# Patient Record
Sex: Male | Born: 1950
Health system: Southern US, Community
[De-identification: ages and names within clinical notes are randomized; demographics above are authoritative.]

## PROBLEM LIST (undated history)

## (undated) DIAGNOSIS — I10 Essential (primary) hypertension: Secondary | ICD-10-CM

## (undated) DIAGNOSIS — E7800 Pure hypercholesterolemia, unspecified: Secondary | ICD-10-CM

## (undated) DIAGNOSIS — G4730 Sleep apnea, unspecified: Secondary | ICD-10-CM

## (undated) HISTORY — PX: COLONOSCOPY: SHX5424

---

## 2002-02-02 ENCOUNTER — Encounter: Payer: Self-pay | Admitting: Family Medicine

## 2002-02-02 ENCOUNTER — Ambulatory Visit (HOSPITAL_COMMUNITY): Admission: RE | Admit: 2002-02-02 | Discharge: 2002-02-02 | Payer: Self-pay | Admitting: Family Medicine

## 2002-02-17 ENCOUNTER — Ambulatory Visit (HOSPITAL_COMMUNITY): Admission: RE | Admit: 2002-02-17 | Discharge: 2002-02-17 | Payer: Self-pay | Admitting: General Surgery

## 2007-03-31 ENCOUNTER — Ambulatory Visit (HOSPITAL_COMMUNITY): Admission: RE | Admit: 2007-03-31 | Discharge: 2007-03-31 | Payer: Self-pay | Admitting: General Surgery

## 2007-05-16 ENCOUNTER — Observation Stay (HOSPITAL_COMMUNITY): Admission: EM | Admit: 2007-05-16 | Discharge: 2007-05-17 | Payer: Self-pay | Admitting: Emergency Medicine

## 2007-06-12 ENCOUNTER — Encounter: Admission: RE | Admit: 2007-06-12 | Discharge: 2007-06-12 | Payer: Self-pay | Admitting: Internal Medicine

## 2008-07-20 IMAGING — CR DG CHEST 2V
2 series · 2 of 2 positions shown · non-contrast
Comparison: None.

CLINICAL DATA: Left-sided chest pain.  
 CHEST - 2 VIEW:

[w chest pa]
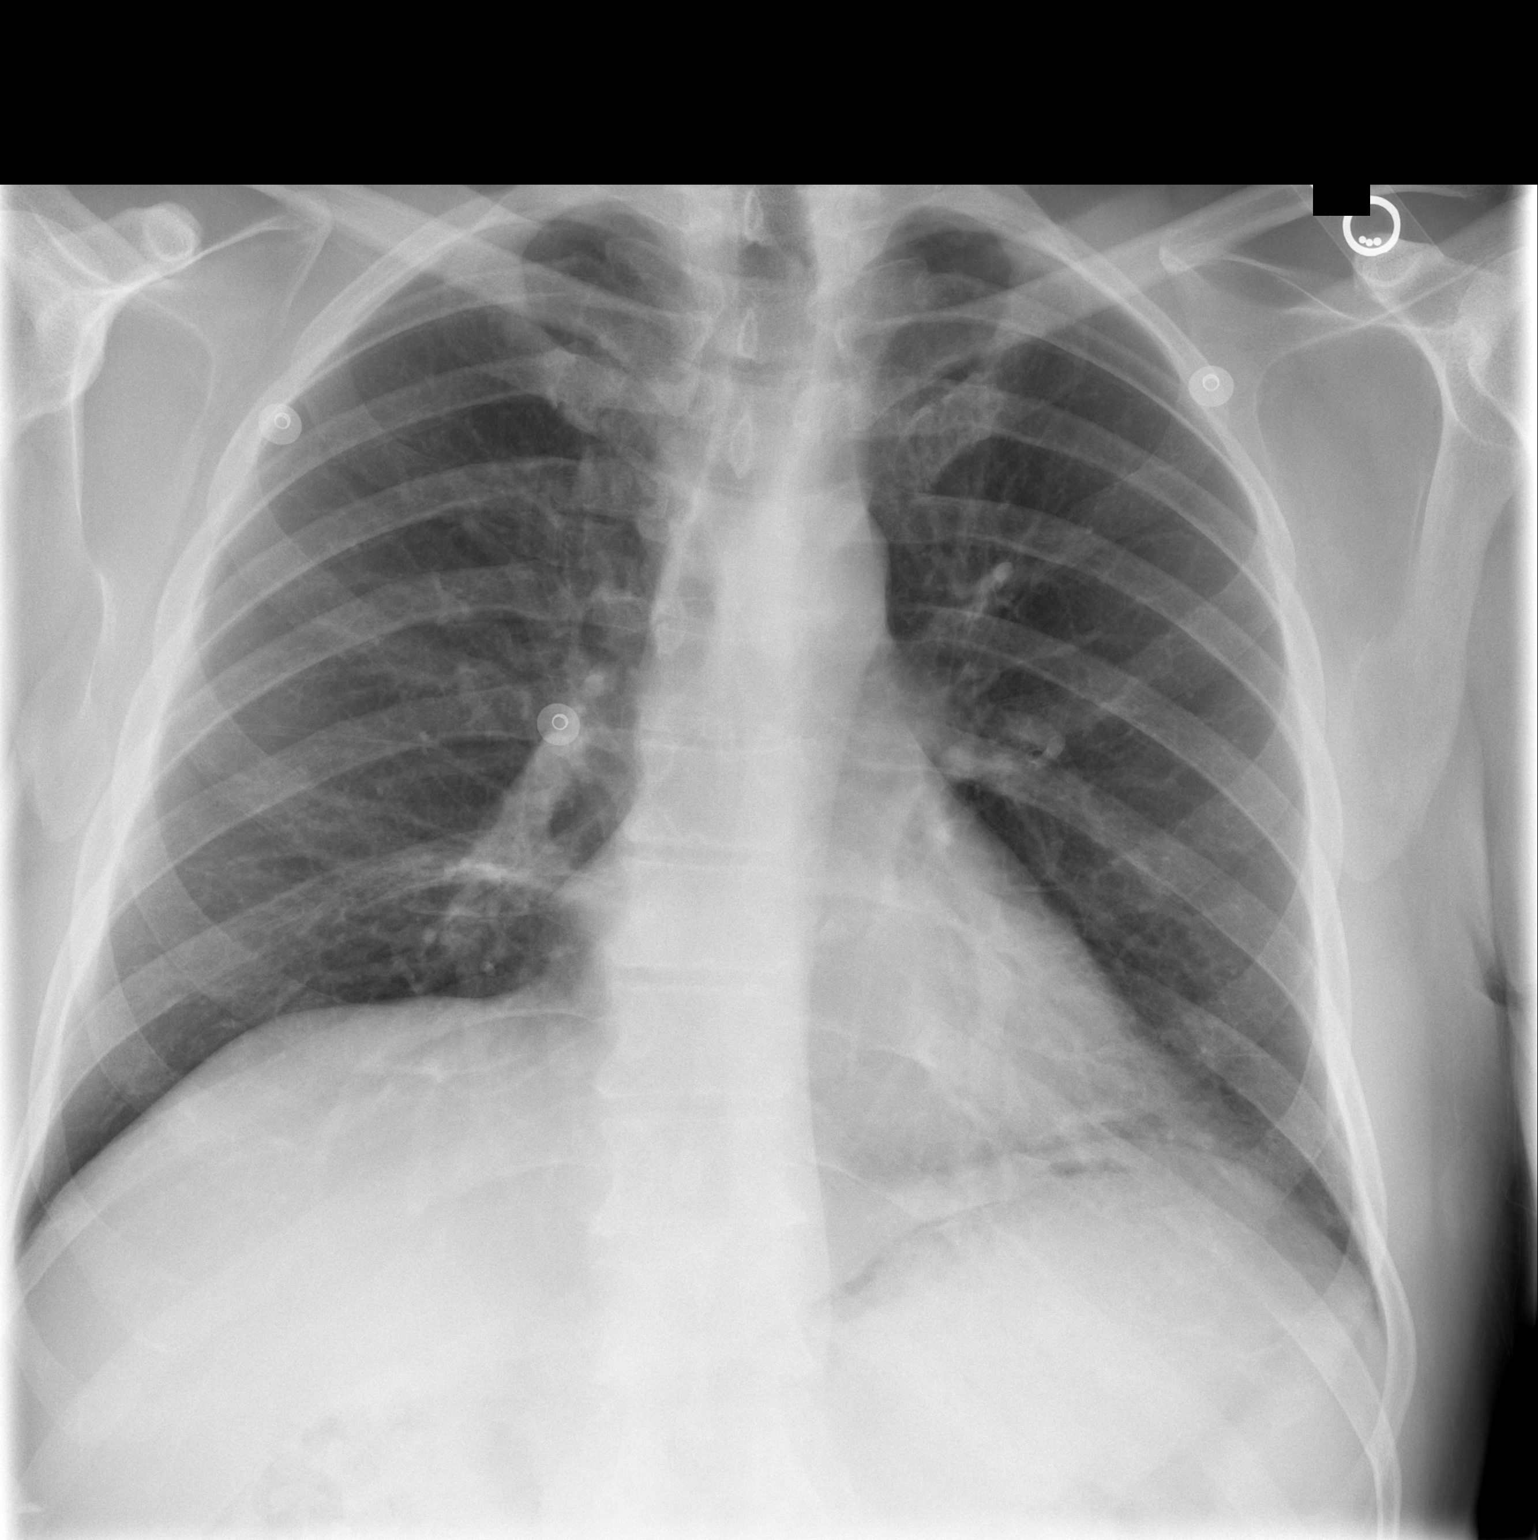

[w chest lat]
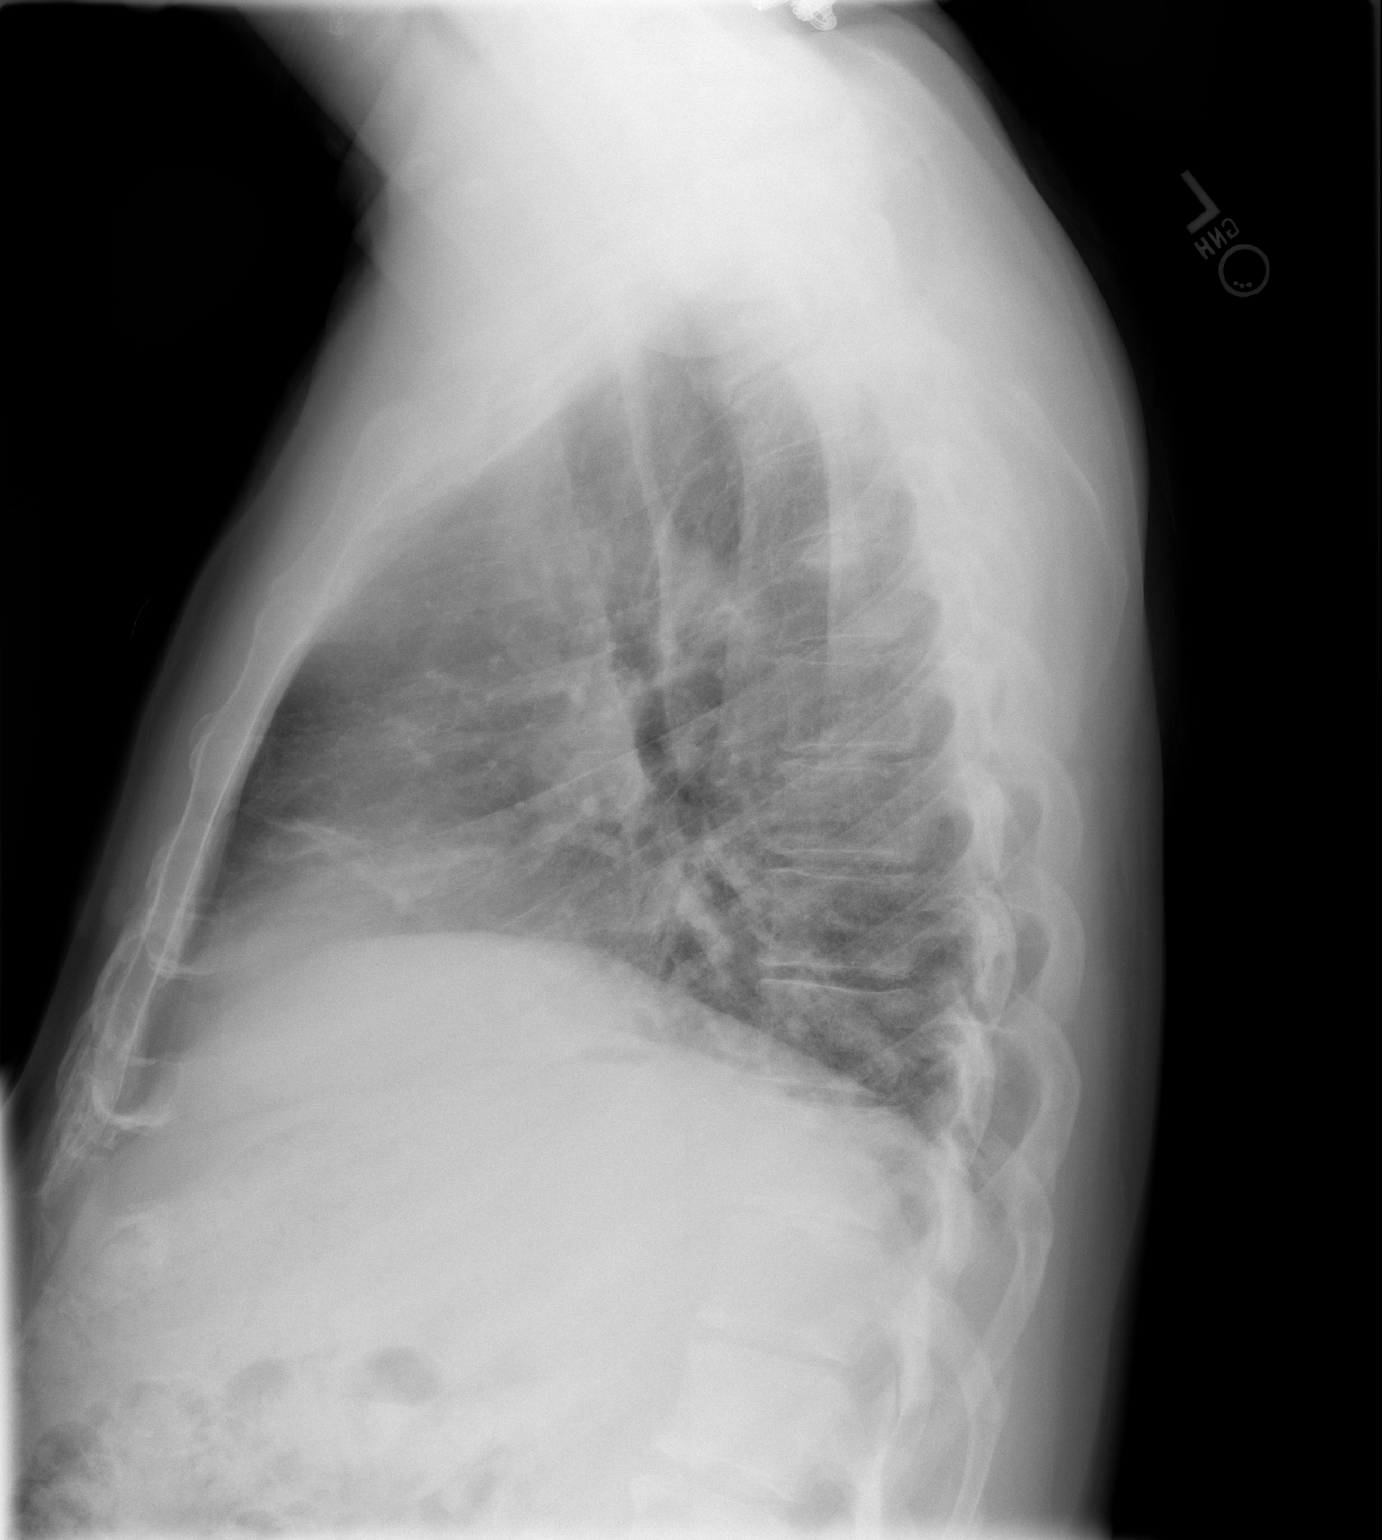

[2 of 2 positions shown; findings below may reference images not displayed]

FINDINGS: Two views of the chest show mild basilar linear atelectasis.   No infiltrate or effusion is seen.  The heart is within normal limits in size.
IMPRESSION: Mild bibasilar linear atelectasis.

## 2011-03-04 NOTE — Cardiovascular Report (Signed)
[name] [name], [name] [name]               ACCOUNT NO.:  [id number]   MEDICAL RECORD NO.:  [id number]          PATIENT TYPE:  INP   LOCATION:  3735                         FACILITY:  [facility]   PHYSICIAN:  Darlin Priestly, MD  DATE OF BIRTH:  August 12, 1951   DATE OF PROCEDURE:  05/17/2007  DATE OF DISCHARGE:  05/17/2007                            CARDIAC CATHETERIZATION   PROCEDURES:  1. Left heart catheterization.  2. Coronary angiography.  3. Left ventriculogram.   ATTENDING PHYSICIAN:  Darlin Priestly, MD   COMPLICATIONS:  None.   INDICATIONS:  Mr. Willemsen is a 60 year old male patient Dr. Kem Boroughs  and Dr. Patrica Duel with a history of hypertension, hyperlipidemia,  recurrent episodes of chest pain status post a negative Cardiolite,  echocardiogram, and event monitor.  He presented to the ER on May 17, 2007, with recurrent chest pain.  He subsequently was admitted and ruled  out for non-Q-wave MI.  He is now brought to cardiac catheterization lab  for definitive diagnosis secondary to recurrent chest pain.   DESCRIPTION OF PROCEDURE:  After informed consent, the patient was  brought to the cardiac catheterization lab.  The right groin was shaved,  prepped, and draped in the usual sterile fashion.  Hemodynamic  monitoring was established.  Using the modified Seldinger technique, a  #6-French arterial sheath was inserted into the right femoral artery.  A  6-French diagnostic catheter was used to perform diagnostic angiography.   Left main is a large vessel with no disease.   LAD is a large vessel coursing to three diagonal branches.  The LAD has  no significant disease.   First, second, third diagonals are all small to medium-size vessels with  no significant disease.   Left circumflex is a medium-size vessel coursing to the AV groove and  gives rise to three obtuse marginal branches.  There is no significant  disease in the AV circumflex.   First, second, third OMs are  all medium-size vessels with no significant  disease.   The right coronary artery is a large vessel which is dominant.  It gives  rise to a PDA and posterolateral branch.  There is mild 30% ostial  narrowing with no further significant disease in the RCA.  The PDA and  posterolateral branch have no significant disease.   Left ventriculogram reveals a preserved EF of 60%.   HEMODYNAMIC RESULTS:  Systemic arterial pressure 110/57, LV pressure  110/8, LVEDP of 15.   CONCLUSION:  1. No significant coronary artery disease.  2. Normal left ventricular systolic function.      Darlin Priestly, MD  Electronically Signed     RHM/MEDQ  D:  05/17/2007  T:  05/17/2007  Job:  [id number]   cc:   Dani Gobble, MD  Patrica Duel, M.D.

## 2011-03-04 NOTE — Discharge Summary (Signed)
[name], [name]               ACCOUNT NO.:  [id number]   MEDICAL RECORD NO.:  [id number]          [name] [name] TYPE:  INP   LOCATION:  3735                         FACILITY:  [facility]   PHYSICIAN:  Darlin Priestly, MD  DATE OF BIRTH:  1951/07/03   DATE OF ADMISSION:  05/16/2007  DATE OF DISCHARGE:  05/17/2007                               DISCHARGE SUMMARY   DISCHARGE DIAGNOSES:  1. Chest pain, status post catheterization with essentially normal      coronary arteries and normal ejection fraction.  2. Presumed gastroesophageal reflux disease.  3. Hypertension.  4. Dyslipidemia.  5. Probable obstructive sleep apnea, resolved after weight loss.  6. Positive family history of coronary artery disease.   HISTORY OF PRESENT ILLNESS/HOSPITAL COURSE:  [name] [name] is a 60 year old  gentleman, [name] [name] of Dr. Domingo Sep, in the [facility], was seen in  the emergency room on May 16, 2007 with complaints of chest pain.  His  chest pain also had atypical component-pleuritic, but at the same time  knowing his risk factors and also pain that was occurring intermittently  and short lived, Dr. Jenne Campus felt it would be more safe if the [name] [name]  would stay in the hospital and rule out MI protocol and have a coronary  angiography.   The next day, the [name] [name] underwent coronary angiography, which  essentially revealed normal coronaries, except 30% of distal RCA  stenosis.  Ejection fraction was normal, 60%.  In followup, [name] [name] was  transferred to the telemetry unit and after his bed rest expired, he was  ambulating in the halls without difficulty, in stable condition and was  discharged home.   His labs revealed normal cardiac enzymes.  He had a normal D-dimer, less  than 22.  Lipid profile showed cholesterol 211, triglycerides 325,  cholesterol HDL 27 and cholesterol LDL 119.   [name] [name] WAS INTOLERANT TO LOPID IN THE PAST.   He is on fish oil.  I am not exactly sure if he has tried any other  statin agents in the past and this needs to be addressed as outpatient.   CBCs showed white blood cell count 8.6, hemoglobin 14.8, hematocrit  42.9, platelet count 156 and CMET showed sodium 140, potassium 3.6,  chloride 108, CO2 27, glucose 106, BUN 16, creatinine 1.02.  SGOT and  SGPT are normal.  Alkaline phosphatase normal.  Total protein 5.5,  albumin 3.2, calcium 8.4.   [name] [name] has known allergy to [facility] and he was premedicated with IV  prednisone, Pepcid and Benadryl.   DISCHARGE MEDICATIONS:  1. Toprol-XL 25 mg daily.  2. Diovan HCT 80/12.5 mg daily.  3. Aspirin 162 mg daily.  4. Fish oil 1000 mg daily.  5. Protonix 40 mg daily.   DISCHARGE FOLLOWUP:  He will be seen by Dr. Domingo Sep in our office in  [place] on August 8 at 2:45 p.m.      Raymon Mutton, P.A.      Darlin Priestly, MD  Electronically Signed    MK/MEDQ  D:  05/17/2007  T:  05/17/2007  Job:  [id number]   cc:  Dani Gobble, MD  Patrica Duel, M.D.

## 2011-03-07 NOTE — H&P (Signed)
[facility]  Patient:    [name] [name], [name] [name] Visit Number: [id number] MRN: [id number]          Service Type: OUT Location: [facility] Attending Physician:  Patrica Duel Dictated by:   Franky Macho, M.D. Admit Date:  02/02/2002 Discharge Date: 02/02/2002   CC:         Patrica Duel, M.D.   History and Physical  DATE OF BIRTH:  [birth year]  CHIEF COMPLAINT:  Family history of colon carcinoma, need for screening colonoscopy.  HISTORY OF PRESENT ILLNESS:  The patient is a 60 year old white male who is referred for screening colonoscopy.  He denies any abdominal complaints.  He has never had a colonoscopy.  He denies any hemorrhoidal problems.  Has a father who had a history of colon carcinoma.  PAST MEDICAL HISTORY:  Hypertension.  PAST SURGICAL HISTORY:  Unremarkable.  CURRENT MEDICATIONS:  Diovan.  ALLERGIES:  No known drug allergies.  REVIEW OF SYSTEMS:  Unremarkable.  PHYSICAL EXAMINATION:  GENERAL:  Well-developed, well-nourished white male in no acute distress.  VITAL SIGNS:  Afebrile, vital signs stable.  LUNGS:  Clear to auscultation, with equal breath sounds bilaterally.  HEART:  Regular rate and rhythm without S3, S4, or murmurs.  ABDOMEN:  Soft, nontender, nondistended.  No hepatosplenomegaly or masses are noted.  RECTAL:  Deferred until the procedure.  IMPRESSION:  Need for screening colonoscopy, family history of colon carcinoma.  PLAN:  The patient is scheduled for colonoscopy on Feb 17, 2002.  The risks and benefits of the procedure including bleeding and perforation were fully explained to the patient, who gave informed consent. Dictated by:   Franky Macho, M.D. Attending Physician:  Patrica Duel DD:  02/15/02 TD:  02/15/02 Job: [id number]

## 2011-08-04 LAB — COMPREHENSIVE METABOLIC PANEL
ALT: 24
AST: 20
Alkaline Phosphatase: 37 — ABNORMAL LOW
CO2: 27
Glucose, Bld: 106 — ABNORMAL HIGH
Potassium: 3.6
Sodium: 140
Total Protein: 5.5 — ABNORMAL LOW

## 2011-08-04 LAB — LIPID PANEL
HDL: 27 — ABNORMAL LOW
Total CHOL/HDL Ratio: 7.8
Triglycerides: 325 — ABNORMAL HIGH
VLDL: 65 — ABNORMAL HIGH

## 2011-08-04 LAB — POCT CARDIAC MARKERS
CKMB, poc: <1 — ABNORMAL LOW
CKMB, poc: <1 — ABNORMAL LOW
Myoglobin, poc: 36.3
Myoglobin, poc: 41.6
Operator id: 196461
Operator id: 196461
Troponin i, poc: <0.05
Troponin i, poc: <0.05

## 2011-08-04 LAB — I-STAT 8, (EC8 V) (CONVERTED LAB)
Chloride: 106
Glucose, Bld: 116 — ABNORMAL HIGH
Potassium: 3.9
pH, Ven: 7.373 — ABNORMAL HIGH

## 2011-08-04 LAB — CBC
HCT: 42.9
Hemoglobin: 14.7
MCHC: 34.3
MCHC: 34.4
MCV: 92.3
Platelets: 156
RBC: 4.65
RDW: 12.9

## 2011-08-04 LAB — POCT I-STAT CREATININE
Creatinine, Ser: 1.1
Operator id: 196461

## 2011-08-04 LAB — LIPASE, BLOOD: Lipase: 30

## 2011-08-04 LAB — TROPONIN I: Troponin I: 0.01

## 2011-08-04 LAB — CK TOTAL AND CKMB (NOT AT ARMC)
CK, MB: 1.5
Relative Index: INVALID

## 2011-08-04 LAB — PROTIME-INR: Prothrombin Time: 12.8

## 2012-03-10 ENCOUNTER — Emergency Department (HOSPITAL_COMMUNITY)
Admission: EM | Admit: 2012-03-10 | Discharge: 2012-03-10 | Disposition: A | Discharge status: Home / Self Care | Payer: 59 | Attending: Emergency Medicine | Admitting: Emergency Medicine

## 2012-03-10 ENCOUNTER — Encounter (HOSPITAL_COMMUNITY): Payer: Self-pay | Admitting: *Deleted

## 2012-03-10 DIAGNOSIS — S61209A Unspecified open wound of unspecified finger without damage to nail, initial encounter: Secondary | ICD-10-CM | POA: Insufficient documentation

## 2012-03-10 DIAGNOSIS — S61219A Laceration without foreign body of unspecified finger without damage to nail, initial encounter: Secondary | ICD-10-CM

## 2012-03-10 DIAGNOSIS — W298XXA Contact with other powered powered hand tools and household machinery, initial encounter: Secondary | ICD-10-CM | POA: Insufficient documentation

## 2012-03-10 DIAGNOSIS — Z79899 Other long term (current) drug therapy: Secondary | ICD-10-CM | POA: Insufficient documentation

## 2012-03-10 DIAGNOSIS — I10 Essential (primary) hypertension: Secondary | ICD-10-CM | POA: Insufficient documentation

## 2012-03-10 DIAGNOSIS — E7800 Pure hypercholesterolemia, unspecified: Secondary | ICD-10-CM | POA: Insufficient documentation

## 2012-03-10 HISTORY — DX: Essential (primary) hypertension: I10

## 2012-03-10 HISTORY — DX: Pure hypercholesterolemia, unspecified: E78.00

## 2012-03-10 MED ORDER — HYDROCODONE-ACETAMINOPHEN 5-325 MG PO TABS
2.0000 | ORAL_TABLET | Freq: Once | ORAL | Status: AC
  Administered 2012-03-10: 2 via ORAL
  Filled 2012-03-10: qty 2

## 2012-03-10 MED ORDER — HYDROCODONE-ACETAMINOPHEN 5-325 MG PO TABS: 1.0000 | ORAL_TABLET | ORAL | Status: AC | PRN

## 2012-03-10 MED ORDER — DIPHTH-ACELL PERTUSSIS-TETANUS 25-58-10 LF-MCG/0.5 IM SUSP
0.5000 mL | Freq: Once | INTRAMUSCULAR | Status: DC
  Filled 2012-03-10: qty 0.5

## 2012-03-10 MED ORDER — LIDOCAINE HCL (PF) 1 % IJ SOLN
5.0000 mL | Freq: Once | INTRAMUSCULAR | Status: AC
  Administered 2012-03-10: 5 mL
  Filled 2012-03-10: qty 5

## 2012-03-10 MED ORDER — ONDANSETRON HCL 4 MG PO TABS
4.0000 mg | ORAL_TABLET | Freq: Once | ORAL | Status: AC
  Administered 2012-03-10: 4 mg via ORAL
  Filled 2012-03-10: qty 1

## 2012-03-10 MED ORDER — TETANUS-DIPHTH-ACELL PERTUSSIS 5-2.5-18.5 LF-MCG/0.5 IM SUSP
0.5000 mL | Freq: Once | INTRAMUSCULAR | Status: AC
  Administered 2012-03-10: 0.5 mL via INTRAMUSCULAR

## 2012-03-10 NOTE — ED Notes (Signed)
Reports laceration to left index finger approx 2 cm; bleeding controlled with bandage in place.

## 2012-03-10 NOTE — Discharge Instructions (Signed)
Please keep in clean and dry. Please have sutures removed in 7 days. Please return to the emergency department if any signs of infection. He received a tetanus shot today. Please update your records.Stitches, Staples, or Skin Adhesive Strips  Stitches (sutures), staples, and skin adhesive strips hold the skin together as it heals. They will usually be in place for 7 days or less. HOME CARE  Wash your hands with soap and water before and after you touch your wound.   Only take medicine as told by your doctor.   Cover your wound only if your doctor told you to. Otherwise, leave it open to air.   Do not get your stitches wet or dirty. If they get dirty, dab them gently with a clean washcloth. Wet the washcloth with soapy water. Do not rub. Pat them dry gently.   Do not put medicine or medicated cream on your stitches unless your doctor told you to.   Do not take out your own stitches or staples. Skin adhesive strips will fall off by themselves.   Do not pick at the wound. Picking can cause an infection.   Do not miss your follow-up appointment.   If you have problems or questions, call your doctor.  GET HELP RIGHT AWAY IF:   You have a temperature by mouth above 102 F (38.9 C), not controlled by medicine.   You have chills.   You have redness or pain around your stitches.   There is puffiness (swelling) around your stitches.   You notice fluid (drainage) from your stitches.   There is a bad smell coming from your wound.  MAKE SURE YOU:  Understand these instructions.   Will watch your condition.   Will get help if you are not doing well or get worse.  Document Released: 08/03/2009 Document Revised: 09/25/2011 Document Reviewed: 08/03/2009 [facility] Patient Information 2012 [place], [place].

## 2012-03-10 NOTE — ED Provider Notes (Signed)
Medical screening examination/treatment/procedure(s) were performed by non-physician practitioner and as supervising physician I was immediately available for consultation/collaboration.   Glynn Octave, MD 03/10/12 2227

## 2012-03-10 NOTE — ED Provider Notes (Addendum)
History     CSN: [id number]  Arrival date & time 03/10/12  2536   First MD Initiated Contact with Patient 03/10/12 2005      Chief Complaint  Patient presents with  . Extremity Laceration    (Consider location/radiation/quality/duration/timing/severity/associated sxs/prior treatment) HPI Comments: Patient states he was cutting a piece of wood when the saw jumped and cut. The left index finger. He applied pressure and irrigated. The area with tap water, but the wound continued to bleed. The patient presents now for assistance with laceration and with the bleeding. He is unsure of the date of his last tetanus.  The history is provided by the patient.    Past Medical History  Diagnosis Date  . Hypertension   . High cholesterol     History reviewed. No pertinent past surgical history.  No family history on file.  History  Substance Use Topics  . Smoking status: Never Smoker   . Smokeless tobacco: Not on file  . Alcohol Use: No      Review of Systems  Constitutional: Negative for activity change.       All ROS Neg except as noted in HPI  HENT: Negative for nosebleeds and neck pain.   Eyes: Negative for photophobia and discharge.  Respiratory: Negative for cough, shortness of breath and wheezing.   Cardiovascular: Negative for chest pain and palpitations.  Gastrointestinal: Negative for abdominal pain and blood in stool.  Genitourinary: Negative for dysuria, frequency and hematuria.  Musculoskeletal: Positive for arthralgias. Negative for back pain.  Skin: Negative.   Neurological: Negative for dizziness, seizures and speech difficulty.  Psychiatric/Behavioral: Negative for hallucinations and confusion.    Allergies  Shrimp; Iodine; and Lopid  Home Medications   Current Outpatient Rx  Name Route Sig Dispense Refill  . VITAMIN D 2000 UNITS PO CAPS Oral Take 1 capsule by mouth daily.    . CO Q 10 100 MG PO CAPS Oral Take 1 capsule by mouth daily.    Marland Kitchen LOSARTAN  POTASSIUM 50 MG PO TABS Oral Take 50 mg by mouth daily.    . ADULT MULTIVITAMIN W/MINERALS CH Oral Take 0.5 tablets by mouth 2 (two) times daily.    . OMEGA-3-ACID ETHYL ESTERS 1 G PO CAPS Oral Take 1 g by mouth daily.    Marland Kitchen PRAVASTATIN SODIUM 20 MG PO TABS Oral Take 20 mg by mouth every evening.    . PSYLLIUM 58.6 % PO POWD Oral Take 1 packet by mouth daily.    . SAW PALMETTO BERRIES PO Oral Take 1 tablet by mouth daily.      BP 147/74  Pulse 73  Temp(Src) 98.4 F (36.9 C) (Oral)  Resp 20  Ht 5\' 10"  (1.778 m)  Wt 175 lb (79.379 kg)  BMI 25.11 kg/m2  SpO2 100%  Physical Exam  Nursing note and vitals reviewed. Constitutional: He is oriented to person, place, and time. He appears well-developed and well-nourished.  Non-toxic appearance.  HENT:  Head: Normocephalic.  Right Ear: Tympanic membrane and external ear normal.  Left Ear: Tympanic membrane and external ear normal.  Eyes: EOM and lids are normal. Pupils are equal, round, and reactive to light.  Neck: Normal range of motion. Neck supple. Carotid bruit is not present.  Cardiovascular: Normal rate, regular rhythm, normal heart sounds, intact distal pulses and normal pulses.   Pulmonary/Chest: Breath sounds normal. No respiratory distress.  Abdominal: Soft. Bowel sounds are normal. There is no tenderness. There is no guarding.  Musculoskeletal: Normal  range of motion.       Laceration of the distal left index finger Active bleeding present. Full range of motion of the finger. Sensory intact.  Lymphadenopathy:       Head (right side): No submandibular adenopathy present.       Head (left side): No submandibular adenopathy present.    He has no cervical adenopathy.  Neurological: He is alert and oriented to person, place, and time. He has normal strength. No cranial nerve deficit or sensory deficit.  Skin: Skin is warm and dry.  Psychiatric: He has a normal mood and affect. His speech is normal.    ED Course  Procedures :  LACERATION REPAIR LEFT INDEX FINGER -  patient identified with wrist band. Permission for repair given by the patient. The wound was cleansed with wound cleanser. Irrigated with saline. Digital block with 1% plain lidocaine. Using sterile technique. The wound was repaired with TWO 4-0 nylon sutures (interrupted) with good wound edge approximation.Wound measures 2.4cm. Bleeding resolved after the wound was stitched. Dressing was applied. Patient tolerated the procedure well. Patient is unsure of the date of his last tetanus and this was updated. Today.  Labs Reviewed - No data to display No results found.   No diagnosis found.    MDM  I have reviewed nursing notes, vital signs, and all appropriate lab and imaging results for this patient. Patient advised to keep the wound to the left index finger clean and dry, and to have the sutures removed in 7 days. Patient is to return to the emergency department if any signs of infection.       Kathie Dike, [place] 03/10/12 2031  Kathie Dike, PA 05/31/12 [phone]

## 2012-03-10 NOTE — ED Notes (Signed)
Telfa and bulky dressing applied. Patient instructed to keep clean and dry.

## 2012-06-01 NOTE — ED Provider Notes (Signed)
Medical screening examination/treatment/procedure(s) were performed by non-physician practitioner and as supervising physician I was immediately available for consultation/collaboration.   Glynn Octave, MD 06/01/12 1416

## 2015-05-30 NOTE — H&P (Signed)
  NTS SOAP Note  Vital Signs:  Vitals as of: 6/94/8546: Systolic 270: Diastolic 81: Heart Rate 68: Temp 21F: Height 64ft 10in: Weight 196Lbs 0 Ounces: BMI 28.12  BMI : 28.12 kg/m2  Subjective: This 64 year old male presents for of need for TCS.  Last had a TCS 8 years ago.  Father had colon cancer.  Denies any gi complaints.  Review of Symptoms:  Constitutional:unremarkable   Head:unremarkable Eyes:unremarkable   Nose/Mouth/Throat:unremarkable Cardiovascular:  unremarkable Respiratory:unremarkable Gastrointestinal:  unremarkable   Genitourinary:unremarkable   Musculoskeletal:unremarkable Skin:unremarkable Hematolgic/Lymphatic:unremarkable   Allergic/Immunologic:unremarkable   Past Medical History:  Reviewed  Past Medical History  Surgical History: hemorrhoid ablation Medical Problems: HTN Allergies: shrimp, statin drugs Medications: losartan   Social History:Reviewed  Social History  Preferred Language: English Race:  White Age: 57 year Marital Status:  M Alcohol: no   Smoking Status: Never smoker reviewed on 03/20/2015 Functional Status reviewed on 03/20/2015 ------------------------------------------------ Bathing: Normal Cooking: Normal Dressing: Normal Driving: Normal Eating: Normal Managing Meds: Normal Oral Care: Normal Shopping: Normal Toileting: Normal Transferring: Normal Walking: Normal Cognitive Status reviewed on 03/20/2015 ------------------------------------------------ Attention: Normal Decision Making: Normal Language: Normal Memory: Normal Motor: Normal Perception: Normal Problem Solving: Normal Visual and Spatial: Normal   Family History:Reviewed  Family Health History Mother  Father, Unknown; Colon cancer;     Objective Information: General:Well appearing, well nourished in no distress. Heart:RRR, no murmur or gallop.  Normal S1, S2.  No S3, S4.  Lungs:  CTA bilaterally, no wheezes,  rhonchi, rales.  Breathing unlabored. Abdomen:Soft, NT/ND, no HSM, no masses. deferred to procedure  Assessment:Family h/o colon cancer  Diagnoses: V16.0  Z80.0 Family history of malignant neoplasm of gastrointestinal tract (Family history of malignant neoplasm of digestive organs)  Procedures: [phone] - OFFICE OUTPATIENT NEW 20 MINUTES    Plan:  Scheduled for colonoscopy on 06/26/2015.  Patient Education:Alternative treatments to surgery were discussed with patient (and family).  Risks and benefits  of procedure including bleeding and perforation were fully explained to the patient (and family) who gave informed consent. Patient/family questions were addressed.  Follow-up:Pending Surgery

## 2015-06-26 ENCOUNTER — Encounter (HOSPITAL_COMMUNITY): Payer: Self-pay | Admitting: *Deleted

## 2015-06-26 ENCOUNTER — Encounter (HOSPITAL_COMMUNITY)
Admission: RE | Disposition: A | Discharge status: Home / Self Care | Payer: Self-pay | Source: Ambulatory Visit | Attending: General Surgery

## 2015-06-26 ENCOUNTER — Ambulatory Visit (HOSPITAL_COMMUNITY)
Admission: RE | Admit: 2015-06-26 | Discharge: 2015-06-26 | Disposition: A | Discharge status: Home / Self Care | Payer: 59 | Source: Ambulatory Visit | Attending: General Surgery | Admitting: General Surgery

## 2015-06-26 DIAGNOSIS — Z79899 Other long term (current) drug therapy: Secondary | ICD-10-CM | POA: Diagnosis not present

## 2015-06-26 DIAGNOSIS — Z800 Family history of malignant neoplasm of digestive organs: Secondary | ICD-10-CM | POA: Insufficient documentation

## 2015-06-26 DIAGNOSIS — I10 Essential (primary) hypertension: Secondary | ICD-10-CM | POA: Diagnosis not present

## 2015-06-26 DIAGNOSIS — Z1211 Encounter for screening for malignant neoplasm of colon: Secondary | ICD-10-CM | POA: Insufficient documentation

## 2015-06-26 HISTORY — PX: COLONOSCOPY: SHX5424

## 2015-06-26 SURGERY — COLONOSCOPY
Anesthesia: Moderate Sedation

## 2015-06-26 MED ORDER — MEPERIDINE HCL 100 MG/ML IJ SOLN
INTRAMUSCULAR | Status: AC
  Filled 2015-06-26: qty 1

## 2015-06-26 MED ORDER — MIDAZOLAM HCL 5 MG/5ML IJ SOLN
INTRAMUSCULAR | Status: AC
  Filled 2015-06-26: qty 5

## 2015-06-26 MED ORDER — MIDAZOLAM HCL 5 MG/5ML IJ SOLN
INTRAMUSCULAR | Status: DC | PRN
Start: 2015-06-26 — End: 2015-06-26
  Administered 2015-06-26: 3 mg via INTRAVENOUS

## 2015-06-26 MED ORDER — SODIUM CHLORIDE 0.9 % IV SOLN
INTRAVENOUS | Status: DC
  Administered 2015-06-26: 08:00:00 via INTRAVENOUS

## 2015-06-26 MED ORDER — MEPERIDINE HCL 50 MG/ML IJ SOLN
INTRAMUSCULAR | Status: DC | PRN
  Administered 2015-06-26: 50 mg via INTRAVENOUS

## 2015-06-26 MED ORDER — STERILE WATER FOR IRRIGATION IR SOLN
Status: DC | PRN
  Administered 2015-06-26: 08:00:00

## 2015-06-26 NOTE — Interval H&P Note (Signed)
History and Physical Interval Note:  06/26/2015 7:54 AM  [name] [name]  has presented today for surgery, with the diagnosis of screening, family history of colon cancer  The various methods of treatment have been discussed with the patient and family. After consideration of risks, benefits and other options for treatment, the patient has consented to  Procedure(s): COLONOSCOPY (N/A) as a surgical intervention .  The patient's history has been reviewed, patient examined, no change in status, stable for surgery.  I have reviewed the patient's chart and labs.  Questions were answered to the patient's satisfaction.     Aviva Signs A

## 2015-06-26 NOTE — Op Note (Signed)
[facility] [address] [place], [address]   COLONOSCOPY PROCEDURE REPORT     EXAM DATE: June 30, 2015  PATIENT NAME:      [name] [name]           MR #:      [id number]  BIRTHDATE:       12-Sep-1951      VISIT #:     [phone]  ATTENDING:     Aviva Signs, MD     STATUS:     outpatient ASSISTANT:  INDICATIONS:  The patient is a 64 yr old male here for a colonoscopy due to patient's immediate family history of colon cancer. PROCEDURE PERFORMED:     Colonoscopy, screening MEDICATIONS:     Demerol 50 mg IV and Versed 3 mg IV ESTIMATED BLOOD LOSS:     None  CONSENT: The patient understands the risks and benefits of the procedure and understands that these risks include, but are not limited to: sedation, allergic reaction, infection, perforation and/or bleeding. Alternative means of evaluation and treatment include, among others: physical exam, x-rays, and/or surgical intervention. The patient elects to proceed with this endoscopic procedure.  DESCRIPTION OF PROCEDURE: During intra-op preparation period all mechanical & medical equipment was checked for proper function. Hand hygiene and appropriate measures for infection prevention was taken. After the risks, benefits and alternatives of the procedure were thoroughly explained, Informed consent was verified, confirmed and timeout was successfully executed by the treatment team. A digital exam revealed no abnormalities of the rectum. The EC-[id number] ([id number]) endoscope was introduced through the anus and advanced to the cecum, which was identified by both the appendix and ileocecal valve. adequate (Trilyte was used) The instrument was then slowly withdrawn as the colon was fully examined.Estimated blood loss is zero unless otherwise noted in this procedure report.   COLON FINDINGS: A normal appearing cecum, ileocecal valve, and appendiceal orifice were identified.  The ascending, transverse, descending,  sigmoid colon, and rectum appeared unremarkable. Retroflexed views revealed no abnormalities. The scope was then completely withdrawn from the patient and the procedure terminated.  SCOPE WITHDRAWAL TIME: 6    ADVERSE EVENTS:      There were no immediate complications.  IMPRESSIONS:     Normal colonoscopy  RECOMMENDATIONS:     Repeat Colonoscopy in 5 years. RECALL:  _____________________________ Aviva Signs, MD eSigned:  Aviva Signs, MD 06-30-2015 8:20 AM   cc:   CPT CODES: ICD CODES:  The ICD and CPT codes recommended by this software are interpretations from the data that the clinical staff has captured with the software.  The verification of the translation of this report to the ICD and CPT codes and modifiers is the sole responsibility of the health care institution and practicing physician where this report was generated.  [employer]. will not be held responsible for the validity of the ICD and CPT codes included on this report.  AMA assumes no liability for data contained or not contained herein. CPT is a Designer, television/film set of the [employer].

## 2015-06-26 NOTE — Discharge Instructions (Signed)
Colonoscopy, Care After Refer to this sheet in the next few weeks. These instructions provide you with information on caring for yourself after your procedure. Your health care provider may also give you more specific instructions. Your treatment has been planned according to current medical practices, but problems sometimes occur. Call your health care provider if you have any problems or questions after your procedure. WHAT TO EXPECT AFTER THE PROCEDURE  After your procedure, it is typical to have the following:  A small amount of blood in your stool.  Moderate amounts of gas and mild abdominal cramping or bloating. HOME CARE INSTRUCTIONS  Do not drive, operate machinery, or sign important documents for 24 hours.  You may shower and resume your regular physical activities, but move at a slower pace for the first 24 hours.  Take frequent rest periods for the first 24 hours.  Walk around or put a warm pack on your abdomen to help reduce abdominal cramping and bloating.  Drink enough fluids to keep your urine clear or pale yellow.  You may resume your normal diet as instructed by your health care provider. Avoid heavy or fried foods that are hard to digest.  Avoid drinking alcohol for 24 hours or as instructed by your health care provider.  Only take over-the-counter or prescription medicines as directed by your health care provider.  If a tissue sample (biopsy) was taken during your procedure:  Do not take aspirin or blood thinners for 7 days, or as instructed by your health care provider.  Do not drink alcohol for 7 days, or as instructed by your health care provider.  Eat soft foods for the first 24 hours. SEEK MEDICAL CARE IF: You have persistent spotting of blood in your stool 2-3 days after the procedure. SEEK IMMEDIATE MEDICAL CARE IF:  You have more than a small spotting of blood in your stool.  You pass large blood clots in your stool.  Your abdomen is swollen  (distended).  You have nausea or vomiting.  You have a fever.  You have increasing abdominal pain that is not relieved with medicine. Document Released: 05/20/2004 Document Revised: 07/27/2013 Document Reviewed: 06/13/2013 [facility] Patient Information 2015 [place], [place]. This information is not intended to replace advice given to you by your health care provider. Make sure you discuss any questions you have with your health care provider.  Colonoscopy, Care After Refer to this sheet in the next few weeks. These instructions provide you with information on caring for yourself after your procedure. Your health care provider may also give you more specific instructions. Your treatment has been planned according to current medical practices, but problems sometimes occur. Call your health care provider if you have any problems or questions after your procedure. WHAT TO EXPECT AFTER THE PROCEDURE  After your procedure, it is typical to have the following:  A small amount of blood in your stool.  Moderate amounts of gas and mild abdominal cramping or bloating. HOME CARE INSTRUCTIONS  Do not drive, operate machinery, or sign important documents for 24 hours.  You may shower and resume your regular physical activities, but move at a slower pace for the first 24 hours.  Take frequent rest periods for the first 24 hours.  Walk around or put a warm pack on your abdomen to help reduce abdominal cramping and bloating.  Drink enough fluids to keep your urine clear or pale yellow.  You may resume your normal diet as instructed by your health care provider.  Avoid heavy or fried foods that are hard to digest.  Avoid drinking alcohol for 24 hours or as instructed by your health care provider.  Only take over-the-counter or prescription medicines as directed by your health care provider.  If a tissue sample (biopsy) was taken during your procedure:  Do not take aspirin or blood thinners for 7  days, or as instructed by your health care provider.  Do not drink alcohol for 7 days, or as instructed by your health care provider.  Eat soft foods for the first 24 hours. SEEK MEDICAL CARE IF: You have persistent spotting of blood in your stool 2-3 days after the procedure. SEEK IMMEDIATE MEDICAL CARE IF:  You have more than a small spotting of blood in your stool.  You pass large blood clots in your stool.  Your abdomen is swollen (distended).  You have nausea or vomiting.  You have a fever.  You have increasing abdominal pain that is not relieved with medicine. Document Released: 05/20/2004 Document Revised: 07/27/2013 Document Reviewed: 06/13/2013 [facility] Patient Information 2015 [place], [place]. This information is not intended to replace advice given to you by your health care provider. Make sure you discuss any questions you have with your health care provider.

## 2015-06-27 ENCOUNTER — Encounter (HOSPITAL_COMMUNITY): Payer: Self-pay | Admitting: General Surgery

## 2016-01-25 DIAGNOSIS — E782 Mixed hyperlipidemia: Secondary | ICD-10-CM | POA: Diagnosis not present

## 2016-01-25 DIAGNOSIS — N400 Enlarged prostate without lower urinary tract symptoms: Secondary | ICD-10-CM | POA: Diagnosis not present

## 2016-01-25 DIAGNOSIS — Z681 Body mass index (BMI) 19 or less, adult: Secondary | ICD-10-CM | POA: Diagnosis not present

## 2016-01-25 DIAGNOSIS — Z0000 Encounter for general adult medical examination without abnormal findings: Secondary | ICD-10-CM | POA: Diagnosis not present

## 2016-01-25 DIAGNOSIS — E663 Overweight: Secondary | ICD-10-CM | POA: Diagnosis not present

## 2016-01-25 DIAGNOSIS — Z125 Encounter for screening for malignant neoplasm of prostate: Secondary | ICD-10-CM | POA: Diagnosis not present

## 2016-01-25 DIAGNOSIS — Z1389 Encounter for screening for other disorder: Secondary | ICD-10-CM | POA: Diagnosis not present

## 2016-01-25 DIAGNOSIS — I10 Essential (primary) hypertension: Secondary | ICD-10-CM | POA: Diagnosis not present

## 2016-01-25 DIAGNOSIS — E781 Pure hyperglyceridemia: Secondary | ICD-10-CM | POA: Diagnosis not present

## 2016-04-15 DIAGNOSIS — Z6827 Body mass index (BMI) 27.0-27.9, adult: Secondary | ICD-10-CM | POA: Diagnosis not present

## 2016-04-15 DIAGNOSIS — Z1389 Encounter for screening for other disorder: Secondary | ICD-10-CM | POA: Diagnosis not present

## 2016-04-15 DIAGNOSIS — I10 Essential (primary) hypertension: Secondary | ICD-10-CM | POA: Diagnosis not present

## 2016-04-15 DIAGNOSIS — E663 Overweight: Secondary | ICD-10-CM | POA: Diagnosis not present

## 2016-04-15 DIAGNOSIS — J209 Acute bronchitis, unspecified: Secondary | ICD-10-CM | POA: Diagnosis not present

## 2016-04-15 DIAGNOSIS — N400 Enlarged prostate without lower urinary tract symptoms: Secondary | ICD-10-CM | POA: Diagnosis not present

## 2016-04-15 DIAGNOSIS — J018 Other acute sinusitis: Secondary | ICD-10-CM | POA: Diagnosis not present

## 2016-05-19 DIAGNOSIS — Z23 Encounter for immunization: Secondary | ICD-10-CM | POA: Diagnosis not present

## 2016-09-02 DIAGNOSIS — Z23 Encounter for immunization: Secondary | ICD-10-CM | POA: Diagnosis not present

## 2017-04-13 DIAGNOSIS — X32XXXD Exposure to sunlight, subsequent encounter: Secondary | ICD-10-CM | POA: Diagnosis not present

## 2017-04-13 DIAGNOSIS — D225 Melanocytic nevi of trunk: Secondary | ICD-10-CM | POA: Diagnosis not present

## 2017-04-13 DIAGNOSIS — L570 Actinic keratosis: Secondary | ICD-10-CM | POA: Diagnosis not present

## 2017-04-13 DIAGNOSIS — Z1283 Encounter for screening for malignant neoplasm of skin: Secondary | ICD-10-CM | POA: Diagnosis not present

## 2017-05-12 ENCOUNTER — Other Ambulatory Visit: Payer: Self-pay

## 2017-05-26 DIAGNOSIS — D225 Melanocytic nevi of trunk: Secondary | ICD-10-CM | POA: Diagnosis not present

## 2017-05-26 DIAGNOSIS — L7211 Pilar cyst: Secondary | ICD-10-CM | POA: Diagnosis not present

## 2017-05-26 DIAGNOSIS — D485 Neoplasm of uncertain behavior of skin: Secondary | ICD-10-CM | POA: Diagnosis not present

## 2017-06-15 DIAGNOSIS — E663 Overweight: Secondary | ICD-10-CM | POA: Diagnosis not present

## 2017-06-15 DIAGNOSIS — I10 Essential (primary) hypertension: Secondary | ICD-10-CM | POA: Diagnosis not present

## 2017-06-15 DIAGNOSIS — Z6828 Body mass index (BMI) 28.0-28.9, adult: Secondary | ICD-10-CM | POA: Diagnosis not present

## 2017-06-15 DIAGNOSIS — Z1389 Encounter for screening for other disorder: Secondary | ICD-10-CM | POA: Diagnosis not present

## 2017-06-15 DIAGNOSIS — E782 Mixed hyperlipidemia: Secondary | ICD-10-CM | POA: Diagnosis not present

## 2017-06-15 DIAGNOSIS — N400 Enlarged prostate without lower urinary tract symptoms: Secondary | ICD-10-CM | POA: Diagnosis not present

## 2017-06-30 DIAGNOSIS — Z23 Encounter for immunization: Secondary | ICD-10-CM | POA: Diagnosis not present

## 2017-07-02 DIAGNOSIS — E748 Other specified disorders of carbohydrate metabolism: Secondary | ICD-10-CM | POA: Diagnosis not present

## 2017-07-02 DIAGNOSIS — E781 Pure hyperglyceridemia: Secondary | ICD-10-CM | POA: Diagnosis not present

## 2017-07-02 DIAGNOSIS — D696 Thrombocytopenia, unspecified: Secondary | ICD-10-CM | POA: Diagnosis not present

## 2017-08-05 DIAGNOSIS — Z23 Encounter for immunization: Secondary | ICD-10-CM | POA: Diagnosis not present

## 2017-09-22 DIAGNOSIS — D225 Melanocytic nevi of trunk: Secondary | ICD-10-CM | POA: Diagnosis not present

## 2017-09-22 DIAGNOSIS — D485 Neoplasm of uncertain behavior of skin: Secondary | ICD-10-CM | POA: Diagnosis not present

## 2017-09-22 DIAGNOSIS — L570 Actinic keratosis: Secondary | ICD-10-CM | POA: Diagnosis not present

## 2017-09-22 DIAGNOSIS — X32XXXD Exposure to sunlight, subsequent encounter: Secondary | ICD-10-CM | POA: Diagnosis not present

## 2017-09-30 DIAGNOSIS — D485 Neoplasm of uncertain behavior of skin: Secondary | ICD-10-CM | POA: Diagnosis not present

## 2017-09-30 DIAGNOSIS — L988 Other specified disorders of the skin and subcutaneous tissue: Secondary | ICD-10-CM | POA: Diagnosis not present

## 2017-09-30 DIAGNOSIS — D225 Melanocytic nevi of trunk: Secondary | ICD-10-CM | POA: Diagnosis not present

## 2017-10-02 DIAGNOSIS — Z6828 Body mass index (BMI) 28.0-28.9, adult: Secondary | ICD-10-CM | POA: Diagnosis not present

## 2017-10-02 DIAGNOSIS — E748 Other specified disorders of carbohydrate metabolism: Secondary | ICD-10-CM | POA: Diagnosis not present

## 2017-10-02 DIAGNOSIS — N400 Enlarged prostate without lower urinary tract symptoms: Secondary | ICD-10-CM | POA: Diagnosis not present

## 2017-10-02 DIAGNOSIS — L570 Actinic keratosis: Secondary | ICD-10-CM | POA: Diagnosis not present

## 2017-10-02 DIAGNOSIS — I10 Essential (primary) hypertension: Secondary | ICD-10-CM | POA: Diagnosis not present

## 2017-10-02 DIAGNOSIS — E782 Mixed hyperlipidemia: Secondary | ICD-10-CM | POA: Diagnosis not present

## 2017-10-02 DIAGNOSIS — Z0001 Encounter for general adult medical examination with abnormal findings: Secondary | ICD-10-CM | POA: Diagnosis not present

## 2018-01-25 DIAGNOSIS — D485 Neoplasm of uncertain behavior of skin: Secondary | ICD-10-CM | POA: Diagnosis not present

## 2018-01-25 DIAGNOSIS — D225 Melanocytic nevi of trunk: Secondary | ICD-10-CM | POA: Diagnosis not present

## 2018-08-02 DIAGNOSIS — Z23 Encounter for immunization: Secondary | ICD-10-CM | POA: Diagnosis not present

## 2018-10-05 DIAGNOSIS — R7309 Other abnormal glucose: Secondary | ICD-10-CM | POA: Diagnosis not present

## 2018-10-05 DIAGNOSIS — Z1389 Encounter for screening for other disorder: Secondary | ICD-10-CM | POA: Diagnosis not present

## 2018-10-05 DIAGNOSIS — E663 Overweight: Secondary | ICD-10-CM | POA: Diagnosis not present

## 2018-10-05 DIAGNOSIS — E748 Other specified disorders of carbohydrate metabolism: Secondary | ICD-10-CM | POA: Diagnosis not present

## 2018-10-05 DIAGNOSIS — E785 Hyperlipidemia, unspecified: Secondary | ICD-10-CM | POA: Diagnosis not present

## 2018-10-05 DIAGNOSIS — Z0001 Encounter for general adult medical examination with abnormal findings: Secondary | ICD-10-CM | POA: Diagnosis not present

## 2018-10-05 DIAGNOSIS — Z125 Encounter for screening for malignant neoplasm of prostate: Secondary | ICD-10-CM | POA: Diagnosis not present

## 2018-10-05 DIAGNOSIS — Z6829 Body mass index (BMI) 29.0-29.9, adult: Secondary | ICD-10-CM | POA: Diagnosis not present

## 2018-10-05 DIAGNOSIS — R946 Abnormal results of thyroid function studies: Secondary | ICD-10-CM | POA: Diagnosis not present

## 2019-07-27 DIAGNOSIS — Z23 Encounter for immunization: Secondary | ICD-10-CM | POA: Diagnosis not present

## 2019-09-13 ENCOUNTER — Other Ambulatory Visit: Payer: Self-pay

## 2019-09-19 DIAGNOSIS — E7849 Other hyperlipidemia: Secondary | ICD-10-CM | POA: Diagnosis not present

## 2019-09-19 DIAGNOSIS — I10 Essential (primary) hypertension: Secondary | ICD-10-CM | POA: Diagnosis not present

## 2019-11-11 ENCOUNTER — Ambulatory Visit: Payer: Medicare Other | Attending: Internal Medicine

## 2019-11-11 DIAGNOSIS — Z23 Encounter for immunization: Secondary | ICD-10-CM

## 2019-11-11 NOTE — Progress Notes (Signed)
   Covid-19 Vaccination Clinic  Name:  [name] [name]    MRN: [id number] DOB: 1951/08/05  11/11/2019  Mr. [name] [name] was observed post Covid-19 immunization for 15 minutes without incidence. He was provided with Vaccine Information Sheet and instruction to access the V-Safe system.   Mr. [name] [name] was instructed to call 911 with any severe reactions post vaccine: Marland Kitchen Difficulty breathing  . Swelling of your face and throat  . A fast heartbeat  . A bad rash all over your body  . Dizziness and weakness    Immunizations Administered    Name Date Dose VIS Date Route   Pfizer COVID-19 Vaccine 11/11/2019  2:08 PM 0.3 mL 09/30/2019 Intramuscular   Manufacturer: [place]   Lot: [id number]   [place]: [id number]

## 2019-11-30 ENCOUNTER — Ambulatory Visit: Payer: 59

## 2019-12-02 ENCOUNTER — Ambulatory Visit: Payer: Medicare Other | Attending: Internal Medicine

## 2019-12-02 DIAGNOSIS — Z23 Encounter for immunization: Secondary | ICD-10-CM | POA: Insufficient documentation

## 2019-12-02 NOTE — Progress Notes (Signed)
   Covid-19 Vaccination Clinic  Name:  [name] [name]    MRN: [id number] DOB: 1951/04/29  12/02/2019  [name] was observed post Covid-19 immunization for 15 minutes without incidence. He was provided with Vaccine Information Sheet and instruction to access the V-Safe system.   [name] was instructed to call 911 with any severe reactions post vaccine: Marland Kitchen Difficulty breathing  . Swelling of your face and throat  . A fast heartbeat  . A bad rash all over your body  . Dizziness and weakness    Immunizations Administered    Name Date Dose VIS Date Route   Pfizer COVID-19 Vaccine 12/02/2019  8:23 AM 0.3 mL 09/30/2019 Intramuscular   Manufacturer: [place]   Lot: [id number]   [place]: [id number]

## 2019-12-27 DIAGNOSIS — Z0000 Encounter for general adult medical examination without abnormal findings: Secondary | ICD-10-CM | POA: Diagnosis not present

## 2019-12-27 DIAGNOSIS — I10 Essential (primary) hypertension: Secondary | ICD-10-CM | POA: Diagnosis not present

## 2019-12-27 DIAGNOSIS — E663 Overweight: Secondary | ICD-10-CM | POA: Diagnosis not present

## 2019-12-27 DIAGNOSIS — Z6826 Body mass index (BMI) 26.0-26.9, adult: Secondary | ICD-10-CM | POA: Diagnosis not present

## 2019-12-27 DIAGNOSIS — R7309 Other abnormal glucose: Secondary | ICD-10-CM | POA: Diagnosis not present

## 2019-12-27 DIAGNOSIS — E782 Mixed hyperlipidemia: Secondary | ICD-10-CM | POA: Diagnosis not present

## 2019-12-27 DIAGNOSIS — N400 Enlarged prostate without lower urinary tract symptoms: Secondary | ICD-10-CM | POA: Diagnosis not present

## 2019-12-27 DIAGNOSIS — Z1389 Encounter for screening for other disorder: Secondary | ICD-10-CM | POA: Diagnosis not present

## 2020-01-06 DIAGNOSIS — G4730 Sleep apnea, unspecified: Secondary | ICD-10-CM | POA: Diagnosis not present

## 2020-01-18 DIAGNOSIS — I10 Essential (primary) hypertension: Secondary | ICD-10-CM | POA: Diagnosis not present

## 2020-01-18 DIAGNOSIS — N400 Enlarged prostate without lower urinary tract symptoms: Secondary | ICD-10-CM | POA: Diagnosis not present

## 2020-01-18 DIAGNOSIS — E7849 Other hyperlipidemia: Secondary | ICD-10-CM | POA: Diagnosis not present

## 2020-01-30 DIAGNOSIS — G4733 Obstructive sleep apnea (adult) (pediatric): Secondary | ICD-10-CM | POA: Diagnosis not present

## 2020-03-21 DIAGNOSIS — G4733 Obstructive sleep apnea (adult) (pediatric): Secondary | ICD-10-CM | POA: Diagnosis not present

## 2020-03-21 DIAGNOSIS — E663 Overweight: Secondary | ICD-10-CM | POA: Diagnosis not present

## 2020-03-21 DIAGNOSIS — E7849 Other hyperlipidemia: Secondary | ICD-10-CM | POA: Diagnosis not present

## 2020-03-21 DIAGNOSIS — Z6825 Body mass index (BMI) 25.0-25.9, adult: Secondary | ICD-10-CM | POA: Diagnosis not present

## 2020-03-21 DIAGNOSIS — I10 Essential (primary) hypertension: Secondary | ICD-10-CM | POA: Diagnosis not present

## 2020-05-09 DIAGNOSIS — Z1283 Encounter for screening for malignant neoplasm of skin: Secondary | ICD-10-CM | POA: Diagnosis not present

## 2020-05-09 DIAGNOSIS — L821 Other seborrheic keratosis: Secondary | ICD-10-CM | POA: Diagnosis not present

## 2020-05-09 DIAGNOSIS — L570 Actinic keratosis: Secondary | ICD-10-CM | POA: Diagnosis not present

## 2020-05-09 DIAGNOSIS — X32XXXD Exposure to sunlight, subsequent encounter: Secondary | ICD-10-CM | POA: Diagnosis not present

## 2020-07-24 DIAGNOSIS — Z23 Encounter for immunization: Secondary | ICD-10-CM | POA: Diagnosis not present

## 2020-08-10 DIAGNOSIS — H35343 Macular cyst, hole, or pseudohole, bilateral: Secondary | ICD-10-CM | POA: Diagnosis not present

## 2020-08-22 DIAGNOSIS — H35343 Macular cyst, hole, or pseudohole, bilateral: Secondary | ICD-10-CM | POA: Diagnosis not present

## 2020-09-06 DIAGNOSIS — H35341 Macular cyst, hole, or pseudohole, right eye: Secondary | ICD-10-CM | POA: Diagnosis not present

## 2020-09-07 DIAGNOSIS — H35341 Macular cyst, hole, or pseudohole, right eye: Secondary | ICD-10-CM | POA: Diagnosis not present

## 2020-09-18 DIAGNOSIS — H35343 Macular cyst, hole, or pseudohole, bilateral: Secondary | ICD-10-CM | POA: Diagnosis not present

## 2020-10-02 DIAGNOSIS — J029 Acute pharyngitis, unspecified: Secondary | ICD-10-CM | POA: Diagnosis not present

## 2020-10-02 DIAGNOSIS — Z681 Body mass index (BMI) 19 or less, adult: Secondary | ICD-10-CM | POA: Diagnosis not present

## 2020-10-10 DIAGNOSIS — H35343 Macular cyst, hole, or pseudohole, bilateral: Secondary | ICD-10-CM | POA: Diagnosis not present

## 2020-10-15 DIAGNOSIS — H35342 Macular cyst, hole, or pseudohole, left eye: Secondary | ICD-10-CM | POA: Diagnosis not present

## 2020-10-16 DIAGNOSIS — H35343 Macular cyst, hole, or pseudohole, bilateral: Secondary | ICD-10-CM | POA: Diagnosis not present

## 2020-10-23 DIAGNOSIS — H3581 Retinal edema: Secondary | ICD-10-CM | POA: Diagnosis not present

## 2020-10-23 DIAGNOSIS — H35343 Macular cyst, hole, or pseudohole, bilateral: Secondary | ICD-10-CM | POA: Diagnosis not present

## 2020-11-13 DIAGNOSIS — H35343 Macular cyst, hole, or pseudohole, bilateral: Secondary | ICD-10-CM | POA: Diagnosis not present

## 2021-01-03 DIAGNOSIS — E74818 Other disorders of glucose transport: Secondary | ICD-10-CM | POA: Diagnosis not present

## 2021-01-03 DIAGNOSIS — Z1389 Encounter for screening for other disorder: Secondary | ICD-10-CM | POA: Diagnosis not present

## 2021-01-03 DIAGNOSIS — I10 Essential (primary) hypertension: Secondary | ICD-10-CM | POA: Diagnosis not present

## 2021-01-03 DIAGNOSIS — Z1331 Encounter for screening for depression: Secondary | ICD-10-CM | POA: Diagnosis not present

## 2021-01-03 DIAGNOSIS — E663 Overweight: Secondary | ICD-10-CM | POA: Diagnosis not present

## 2021-01-03 DIAGNOSIS — Z6825 Body mass index (BMI) 25.0-25.9, adult: Secondary | ICD-10-CM | POA: Diagnosis not present

## 2021-01-03 DIAGNOSIS — N400 Enlarged prostate without lower urinary tract symptoms: Secondary | ICD-10-CM | POA: Diagnosis not present

## 2021-01-03 DIAGNOSIS — Z0001 Encounter for general adult medical examination with abnormal findings: Secondary | ICD-10-CM | POA: Diagnosis not present

## 2021-01-03 DIAGNOSIS — E7849 Other hyperlipidemia: Secondary | ICD-10-CM | POA: Diagnosis not present

## 2021-02-15 DIAGNOSIS — H35343 Macular cyst, hole, or pseudohole, bilateral: Secondary | ICD-10-CM | POA: Diagnosis not present

## 2021-02-15 DIAGNOSIS — H35421 Microcystoid degeneration of retina, right eye: Secondary | ICD-10-CM | POA: Diagnosis not present

## 2021-02-15 DIAGNOSIS — H2513 Age-related nuclear cataract, bilateral: Secondary | ICD-10-CM | POA: Diagnosis not present

## 2021-04-18 DIAGNOSIS — H01002 Unspecified blepharitis right lower eyelid: Secondary | ICD-10-CM | POA: Diagnosis not present

## 2021-04-18 DIAGNOSIS — H01001 Unspecified blepharitis right upper eyelid: Secondary | ICD-10-CM | POA: Diagnosis not present

## 2021-04-18 DIAGNOSIS — H01004 Unspecified blepharitis left upper eyelid: Secondary | ICD-10-CM | POA: Diagnosis not present

## 2021-04-18 DIAGNOSIS — H2513 Age-related nuclear cataract, bilateral: Secondary | ICD-10-CM | POA: Diagnosis not present

## 2021-04-18 DIAGNOSIS — H35373 Puckering of macula, bilateral: Secondary | ICD-10-CM | POA: Diagnosis not present

## 2021-04-23 NOTE — H&P (Signed)
Surgical History & Physical  Patient Name: [name] [name] DOB: Mar 09, 1951  Surgery: Cataract extraction with intraocular lens implant phacoemulsification; Right Eye  Surgeon: Baruch Goldmann MD Surgery Date:  05/03/2021 Pre-Op Date:  04/18/2021  HPI: A 59 Yr. old male patient Pt referred by Dr. Jorja Loa for cataract evaluation. The patient complains of difficulty when viewing TV, reading closed caption, news scrolls on TV, which began 6 months- 1 year ago. Both eyes are affected. The episode is gradual. Pt noticed worsening vision after retinal sx last fall. The condition's severity is worsening. The complaint is associated with blurry vision and hazy. Pt have difficulty recognizing faces at distance. Symptoms are negatively affecting pt's quality of life. Pt using AT's prn to combat dry eye. Pt denies any eye pain or increase in floaters. HPI Completed by Dr. 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 High Blood Pressure  Review of Systems Negative Allergic/Immunologic Negative Cardiovascular Negative Constitutional Negative Ear, Nose, Mouth & Throat Negative Endocrine Negative Eyes Negative Gastrointestinal Negative Genitourinary Negative Hemotologic/Lymphatic Negative Integumentary Negative Musculoskeletal Negative Neurological Negative Psychiatry Negative Respiratory  Social   Never smoked   Medication Losartan, Fish Oil,   Sx/Procedures Retinal Sx OU (PPV/ILM peel/GFX),   Drug Allergies  -statin drugs, Adhesive tape,   History & Physical: Heent:  Cataract, Right eye NECK: supple without bruits LUNGS: lungs clear to auscultation CV: regular rate and rhythm Abdomen: soft and non-tender  Impression & Plan: Assessment: 1.  NUCLEAR SCLEROSIS AGE RELATED; Both Eyes (H25.13) 2.  BLEPHARITIS; Right Upper Lid, Right Lower Lid, Left Upper Lid, Left Lower Lid (H01.001, H01.002,H01.004,H01.005) 3.  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  Cataract accounts for the patient's decreased vision. This visual impairment is not correctable with a tolerable change in glasses or contact lenses. Cataract surgery with an implantation of a new lens should significantly improve the visual and functional status of the patient. Discussed all risks, benefits, alternatives, and potential complications. Discussed the procedures and recovery. Patient desires to have surgery. A-scan ordered and performed today for intra-ocular lens calculations. The surgery will be performed in order to improve vision for driving, reading, and for eye examinations. Recommend phacoemulsification with intra-ocular lens. Recommend Dextenza for post-operative pain and inflammation. Right Eye non-dominant - first. Dilates poorly - shugacaine by protocol. Malyugin Ring in room [place].  2.  Recommend regular lid cleaning.  3.  Discussed diagnosis in detail with patient.

## 2021-04-29 DIAGNOSIS — H2511 Age-related nuclear cataract, right eye: Secondary | ICD-10-CM | POA: Diagnosis not present

## 2021-05-01 ENCOUNTER — Other Ambulatory Visit: Payer: Self-pay

## 2021-05-01 ENCOUNTER — Encounter (HOSPITAL_COMMUNITY)
Admission: RE | Admit: 2021-05-01 | Discharge: 2021-05-01 | Disposition: A | Discharge status: Home / Self Care | Payer: Medicare Other | Source: Ambulatory Visit | Attending: Ophthalmology | Admitting: Ophthalmology

## 2021-05-01 ENCOUNTER — Encounter (HOSPITAL_COMMUNITY): Payer: Self-pay

## 2021-05-01 HISTORY — DX: Sleep apnea, unspecified: G47.30

## 2021-05-03 ENCOUNTER — Encounter (HOSPITAL_COMMUNITY): Payer: Self-pay | Admitting: Ophthalmology

## 2021-05-03 ENCOUNTER — Encounter (HOSPITAL_COMMUNITY)
Admission: RE | Disposition: A | Discharge status: Home / Self Care | Payer: Self-pay | Source: Home / Self Care | Attending: Ophthalmology

## 2021-05-03 ENCOUNTER — Ambulatory Visit (HOSPITAL_COMMUNITY): Payer: Medicare Other | Admitting: Certified Registered"

## 2021-05-03 ENCOUNTER — Ambulatory Visit (HOSPITAL_COMMUNITY)
Admission: RE | Admit: 2021-05-03 | Discharge: 2021-05-03 | Disposition: A | Discharge status: Home / Self Care | Payer: Medicare Other | Attending: Ophthalmology | Admitting: Ophthalmology

## 2021-05-03 ENCOUNTER — Other Ambulatory Visit: Payer: Self-pay

## 2021-05-03 DIAGNOSIS — H35373 Puckering of macula, bilateral: Secondary | ICD-10-CM | POA: Diagnosis not present

## 2021-05-03 DIAGNOSIS — H5711 Ocular pain, right eye: Secondary | ICD-10-CM | POA: Diagnosis not present

## 2021-05-03 DIAGNOSIS — H0100A Unspecified blepharitis right eye, upper and lower eyelids: Secondary | ICD-10-CM | POA: Insufficient documentation

## 2021-05-03 DIAGNOSIS — Z91048 Other nonmedicinal substance allergy status: Secondary | ICD-10-CM | POA: Insufficient documentation

## 2021-05-03 DIAGNOSIS — Z79899 Other long term (current) drug therapy: Secondary | ICD-10-CM | POA: Diagnosis not present

## 2021-05-03 DIAGNOSIS — I10 Essential (primary) hypertension: Secondary | ICD-10-CM | POA: Insufficient documentation

## 2021-05-03 DIAGNOSIS — H2513 Age-related nuclear cataract, bilateral: Secondary | ICD-10-CM | POA: Diagnosis not present

## 2021-05-03 DIAGNOSIS — H0100B Unspecified blepharitis left eye, upper and lower eyelids: Secondary | ICD-10-CM | POA: Diagnosis not present

## 2021-05-03 DIAGNOSIS — Z888 Allergy status to other drugs, medicaments and biological substances status: Secondary | ICD-10-CM | POA: Insufficient documentation

## 2021-05-03 DIAGNOSIS — H2511 Age-related nuclear cataract, right eye: Secondary | ICD-10-CM | POA: Diagnosis not present

## 2021-05-03 SURGERY — CATARACT EXTRACTION PHACO AND INTRAOCULAR LENS PLACEMENT (IOC) with placement of Corticosteroid
Anesthesia: Monitor Anesthesia Care | Site: Eye | Laterality: Right

## 2021-05-03 MED ORDER — EPINEPHRINE PF 1 MG/ML IJ SOLN
INTRAOCULAR | Status: DC | PRN
  Administered 2021-05-03: 500 mL

## 2021-05-03 MED ORDER — LIDOCAINE HCL 3.5 % OP GEL
1.0000 "application " | Freq: Once | OPHTHALMIC | Status: AC
  Administered 2021-05-03: 1 via OPHTHALMIC

## 2021-05-03 MED ORDER — POVIDONE-IODINE 5 % OP SOLN
OPHTHALMIC | Status: DC | PRN
  Administered 2021-05-03: 1 via OPHTHALMIC

## 2021-05-03 MED ORDER — DEXAMETHASONE 0.4 MG OP INST
VAGINAL_INSERT | OPHTHALMIC | Status: AC
  Filled 2021-05-03: qty 1

## 2021-05-03 MED ORDER — PHENYLEPHRINE HCL 2.5 % OP SOLN
1.0000 [drp] | OPHTHALMIC | Status: AC | PRN
  Administered 2021-05-03 (×3): 1 [drp] via OPHTHALMIC

## 2021-05-03 MED ORDER — LIDOCAINE HCL (PF) 1 % IJ SOLN
INTRAOCULAR | Status: DC | PRN
  Administered 2021-05-03: 1 mL via OPHTHALMIC

## 2021-05-03 MED ORDER — PHENYLEPHRINE-KETOROLAC 1-0.3 % IO SOLN
INTRAOCULAR | Status: AC
  Filled 2021-05-03: qty 4

## 2021-05-03 MED ORDER — TETRACAINE HCL 0.5 % OP SOLN
1.0000 [drp] | OPHTHALMIC | Status: AC | PRN
  Administered 2021-05-03 (×3): 1 [drp] via OPHTHALMIC

## 2021-05-03 MED ORDER — BSS IO SOLN
INTRAOCULAR | Status: DC | PRN
  Administered 2021-05-03: 15 mL via INTRAOCULAR

## 2021-05-03 MED ORDER — SODIUM HYALURONATE 10 MG/ML IO SOLUTION
PREFILLED_SYRINGE | INTRAOCULAR | Status: DC | PRN
  Administered 2021-05-03: 0.85 mL via INTRAOCULAR

## 2021-05-03 MED ORDER — SODIUM HYALURONATE 23MG/ML IO SOSY
PREFILLED_SYRINGE | INTRAOCULAR | Status: DC | PRN
  Administered 2021-05-03: 0.6 mL via INTRAOCULAR

## 2021-05-03 MED ORDER — TROPICAMIDE 1 % OP SOLN
1.0000 [drp] | OPHTHALMIC | Status: AC
  Administered 2021-05-03 (×3): 1 [drp] via OPHTHALMIC

## 2021-05-03 MED ORDER — EPINEPHRINE PF 1 MG/ML IJ SOLN
INTRAMUSCULAR | Status: AC
  Filled 2021-05-03: qty 1

## 2021-05-03 MED ORDER — STERILE WATER FOR IRRIGATION IR SOLN
Status: DC | PRN
  Administered 2021-05-03: 250 mL

## 2021-05-03 MED ORDER — DEXAMETHASONE 0.4 MG OP INST
VAGINAL_INSERT | OPHTHALMIC | Status: DC | PRN
  Administered 2021-05-03: 0.4 mg via OPHTHALMIC

## 2021-05-03 SURGICAL SUPPLY — 11 items
CLOTH BEACON ORANGE TIMEOUT ST (SAFETY) ×2 IMPLANT
EYE SHIELD UNIVERSAL CLEAR (GAUZE/BANDAGES/DRESSINGS) ×2 IMPLANT
GLOVE SURG UNDER POLY LF SZ6.5 (GLOVE) ×2 IMPLANT
GLOVE SURG UNDER POLY LF SZ7 (GLOVE) ×2 IMPLANT
NEEDLE HYPO 18GX1.5 BLUNT FILL (NEEDLE) ×2 IMPLANT
PAD ARMBOARD 7.5X6 YLW CONV (MISCELLANEOUS) ×2 IMPLANT
SYR TB 1ML LL NO SAFETY (SYRINGE) ×2 IMPLANT
TAPE SURG TRANSPORE 1 IN (GAUZE/BANDAGES/DRESSINGS) ×1 IMPLANT
TAPE SURGICAL TRANSPORE 1 IN (GAUZE/BANDAGES/DRESSINGS) ×2
Tecnis 1 Piece IOL (Intraocular Lens) ×2 IMPLANT
WATER STERILE IRR 250ML POUR (IV SOLUTION) ×2 IMPLANT

## 2021-05-03 NOTE — Transfer of Care (Signed)
Immediate Anesthesia Transfer of Care Note  Patient: [name] [name]  Procedure(s) Performed: CATARACT EXTRACTION PHACO AND INTRAOCULAR LENS PLACEMENT RIGHT EYE WITH PLACEMENT OF CORTICOSTEROID (Right: Eye)  Patient Location: Short Stay  Anesthesia Type:MAC  Level of Consciousness: awake, alert  and oriented  Airway & Oxygen Therapy: Patient Spontanous Breathing  Post-op Assessment: Report given to RN and Post -op Vital signs reviewed and stable  Post vital signs: Reviewed and stable  Last Vitals:  Vitals Value Taken Time  BP    Temp    Pulse    Resp    SpO2      Last Pain:  Vitals:   05/03/21 1121  TempSrc: Oral  PainSc: 0-No pain         Complications: No notable events documented.

## 2021-05-03 NOTE — Discharge Instructions (Signed)
Please discharge patient when stable, will follow up today with Dr. Felder Lebeda at the [facility] office immediately following discharge.  Leave shield in place until visit.  All paperwork with discharge instructions will be given at the office.  [facility] Address:  [address]  [place], [place] [address]  

## 2021-05-03 NOTE — Anesthesia Postprocedure Evaluation (Signed)
Anesthesia Post Note  Patient: [name] [name]  Procedure(s) Performed: CATARACT EXTRACTION PHACO AND INTRAOCULAR LENS PLACEMENT RIGHT EYE WITH PLACEMENT OF CORTICOSTEROID (Right: Eye)  Patient location during evaluation: Phase II Anesthesia Type: MAC Level of consciousness: awake Pain management: pain level controlled Vital Signs Assessment: post-procedure vital signs reviewed and stable Respiratory status: spontaneous breathing and respiratory function stable Cardiovascular status: blood pressure returned to baseline and stable Postop Assessment: no headache and no apparent nausea or vomiting Anesthetic complications: no Comments: Late entry   No notable events documented.   Last Vitals:  Vitals:   05/03/21 1121 05/03/21 1123  BP:  (!) 152/81  Pulse: 65   Resp: 16   Temp: 36.7 C   SpO2: 96%     Last Pain:  Vitals:   05/03/21 1121  TempSrc: Oral  PainSc: 0-No pain                 Louann Sjogren

## 2021-05-03 NOTE — Anesthesia Preprocedure Evaluation (Signed)
Anesthesia Evaluation  Patient identified by MRN, date of birth, ID band Patient awake    Reviewed: Allergy & Precautions, H&P , NPO status , Patient's Chart, lab work & pertinent test results, reviewed documented beta blocker date and time   Airway Mallampati: II  TM Distance: >3 FB Neck ROM: full    Dental no notable dental hx.    Pulmonary neg pulmonary ROS,    Pulmonary exam normal breath sounds clear to auscultation       Cardiovascular Exercise Tolerance: Good hypertension, negative cardio ROS   Rhythm:regular Rate:Normal     Neuro/Psych negative neurological ROS  negative psych ROS   GI/Hepatic negative GI ROS, Neg liver ROS,   Endo/Other  negative endocrine ROS  Renal/GU negative Renal ROS  negative genitourinary   Musculoskeletal   Abdominal   Peds  Hematology negative hematology ROS (+)   Anesthesia Other Findings   Reproductive/Obstetrics negative OB ROS                             Anesthesia Physical Anesthesia Plan  ASA: 2  Anesthesia Plan: MAC   Post-op Pain Management:    Induction:   PONV Risk Score and Plan:   Airway Management Planned:   Additional Equipment:   Intra-op Plan:   Post-operative Plan:   Informed Consent: I have reviewed the patients History and Physical, chart, labs and discussed the procedure including the risks, benefits and alternatives for the proposed anesthesia with the patient or authorized representative who has indicated his/her understanding and acceptance.     Dental Advisory Given  Plan Discussed with: CRNA  Anesthesia Plan Comments:         Anesthesia Quick Evaluation

## 2021-05-03 NOTE — Interval H&P Note (Signed)
History and Physical Interval Note:  05/03/2021 12:03 PM  [name] [name]  has presented today for surgery, with the diagnosis of Nuclear sclerotic cataract - Right eye.  The various methods of treatment have been discussed with the patient and family. After consideration of risks, benefits and other options for treatment, the patient has consented to  Procedure(s) with comments: CATARACT EXTRACTION PHACO AND INTRAOCULAR LENS PLACEMENT ([place]) with placement of Corticosteroid (Right) - right as a surgical intervention.  The patient's history has been reviewed, patient examined, no change in status, stable for surgery.  I have reviewed the patient's chart and labs.  Questions were answered to the patient's satisfaction.     Baruch Goldmann

## 2021-05-03 NOTE — Op Note (Signed)
Date of procedure: 05/03/21  Pre-operative diagnosis:  Visually significant nuclear age-related cataract, Right Eye (H25.11)  Post-operative diagnosis:   1. Visually significant nuclear age-related cataract, Right Eye (H25.11) 2. Pain and inflammation following cataract surgery Right Eye (H57.11)  Procedure:  Removal of cataract via phacoemulsification and insertion of intra-ocular lens [employer] DCB00  +23.0D into the capsular bag of the Right Eye 2. Placement of Dextenza insert, Right Eye  Attending surgeon: Gerda Diss. Efrem Pitstick, MD, MA  Anesthesia: MAC, Topical Akten  Complications: None  Estimated Blood Loss: <56m (minimal)  Specimens: None  Implants: As above  Indications:  Visually significant age-related cataract, Right Eye  Procedure:  The patient was seen and identified in the pre-operative area. The operative eye was identified and dilated.  The operative eye was marked.  Topical anesthesia was administered to the operative eye.     The patient was then to the operative suite and placed in the supine position.  A timeout was performed confirming the patient, procedure to be performed, and all other relevant information.   The patient's face was prepped and draped in the usual fashion for intra-ocular surgery.  A lid speculum was placed into the operative eye and the surgical microscope moved into place and focused.  A superotemporal paracentesis was created using a 20 gauge paracentesis blade.  Shugarcaine was injected into the anterior chamber.  Viscoelastic was injected into the anterior chamber.  A temporal clear-corneal main wound incision was created using a 2.457mmicrokeratome.  A continuous curvilinear capsulorrhexis was initiated using an irrigating cystitome and completed using capsulorrhexis forceps.  Hydrodissection and hydrodeliniation were performed.  Viscoelastic was injected into the anterior chamber.  A phacoemulsification handpiece and a chopper as  a second instrument were used to remove the nucleus and epinucleus. The irrigation/aspiration handpiece was used to remove any remaining cortical material.   The capsular bag was reinflated with viscoelastic, checked, and found to be intact.  The intraocular lens was inserted into the capsular bag.  The irrigation/aspiration handpiece was used to remove any remaining viscoelastic.  The clear corneal wound and paracentesis wounds were then hydrated and checked with Weck-Cels to be watertight.    The lid-speculum was removed. The lower punctum was dilated. A Dextenza implant was placed in the lower canaliculus without complication.  The drape was removed.  The patient's face was cleaned with a wet and dry 4x4. A clear shield was taped over the eye. The patient was taken to the post-operative care unit in good condition, having tolerated the procedure well.  Post-Op Instructions: [facility] and will receive all other orders and instructions.

## 2021-05-03 NOTE — Anesthesia Procedure Notes (Signed)
Procedure Name: MAC Date/Time: 05/03/2021 12:46 PM Performed by: Orlie Dakin, CRNA Pre-anesthesia Checklist: Patient identified, Emergency Drugs available, Suction available and Patient being monitored Patient Re-evaluated:Patient Re-evaluated prior to induction Oxygen Delivery Method: Nasal cannula Placement Confirmation: positive ETCO2

## 2021-05-20 ENCOUNTER — Other Ambulatory Visit: Payer: Self-pay

## 2021-05-20 ENCOUNTER — Encounter (HOSPITAL_COMMUNITY)
Admission: RE | Admit: 2021-05-20 | Discharge: 2021-05-20 | Disposition: A | Discharge status: Home / Self Care | Payer: Medicare Other | Source: Ambulatory Visit | Attending: Ophthalmology | Admitting: Ophthalmology

## 2021-05-20 DIAGNOSIS — H2512 Age-related nuclear cataract, left eye: Secondary | ICD-10-CM | POA: Diagnosis not present

## 2021-05-21 NOTE — H&P (Signed)
Surgical History & Physical  Patient Name: [name] [name] DOB: 05-Mar-1951  Surgery: Cataract extraction with intraocular lens implant phacoemulsification; Left Eye  Surgeon: Baruch Goldmann MD Surgery Date:  05/24/2021 Pre-Op Date:  05/16/2021  HPI: A 1 Yr. old male patient 1. 1. The patient complains of difficulty when viewing TV, reading closed caption, news scrolls on TV, which began less than 1 year ago. The left eye is affected. The episode is gradual. The condition's severity increased since last visit. Symptoms occur when the patient is inside and outside. this is This is negatively affecting the patient's quality of life. 2. The patient is returning after cataract post-op w/ Dextenza. The right eye is affected. Status post cataract post-op, which began 1 week ago: Since the last visit, the affected area is doing well. The patient's vision is improved. Patient used postop medication instructions for 1 week. HPI Completed by Dr. 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 High Blood Pressure  Review of Systems Negative Allergic/Immunologic Negative Cardiovascular Negative Constitutional Negative Ear, Nose, Mouth & Throat Negative Endocrine Negative Eyes Negative Gastrointestinal Negative Genitourinary Negative Hemotologic/Lymphatic Negative Integumentary Negative Musculoskeletal Negative Neurological Negative Psychiatry Negative Respiratory  Social   Never smoked   Medication Ilevro, Moxifloxacin,  Losartan, Fish Oil,   Sx/Procedures Retinal Sx OU (PPV/ILM peel/GFX), Phaco c IOL OD with Dextenza,   Drug Allergies  -statin drugs, Adhesive tape,   History & Physical: Heent:  Cataract, Left eye NECK: supple without bruits LUNGS: lungs clear to auscultation CV: regular rate and rhythm Abdomen: soft and non-tender  Impression & Plan: Assessment: 1.  CATARACT EXTRACTION STATUS; Right Eye (Z98.41) 2.   INTRAOCULAR LENS IOL (Z96.1) 3.  NUCLEAR SCLEROSIS AGE RELATED; , Left Eye (H25.12)  Plan: 1.  2 weeks after cataract surgery. Doing well with improved vision and normal eye pressure. Call with any problems or concerns. Stop all post-operative medications. Call with any concerning symptoms.  2.  Doing well since surgery  3.  Cataract accounts for the patient's decreased vision. This visual impairment is not correctable with a tolerable change in glasses or contact lenses. Cataract surgery with an implantation of a new lens should significantly improve the visual and functional status of the patient. Discussed all risks, benefits, alternatives, and potential complications. Discussed the procedures and recovery. Patient desires to have surgery. A-scan ordered and performed today for intra-ocular lens calculations. The surgery will be performed in order to improve vision for driving, reading, and for eye examinations. Recommend phacoemulsification with intra-ocular lens. Recommend Dextenza for post-operative pain and inflammation. Left Eye. Surgery required to correct imbalance of vision. Dilates poorly - shugacaine by protocol. [place].

## 2021-05-24 ENCOUNTER — Ambulatory Visit (HOSPITAL_COMMUNITY): Payer: Medicare Other | Admitting: Certified Registered"

## 2021-05-24 ENCOUNTER — Encounter (HOSPITAL_COMMUNITY): Payer: Self-pay | Admitting: Ophthalmology

## 2021-05-24 ENCOUNTER — Encounter (HOSPITAL_COMMUNITY)
Admission: RE | Disposition: A | Discharge status: Home / Self Care | Payer: Self-pay | Source: Home / Self Care | Attending: Ophthalmology

## 2021-05-24 ENCOUNTER — Encounter (HOSPITAL_COMMUNITY): Payer: Medicare Other

## 2021-05-24 ENCOUNTER — Ambulatory Visit (HOSPITAL_COMMUNITY)
Admission: RE | Admit: 2021-05-24 | Discharge: 2021-05-24 | Disposition: A | Discharge status: Home / Self Care | Payer: Medicare Other | Attending: Ophthalmology | Admitting: Ophthalmology

## 2021-05-24 DIAGNOSIS — H5712 Ocular pain, left eye: Secondary | ICD-10-CM | POA: Diagnosis not present

## 2021-05-24 DIAGNOSIS — H25812 Combined forms of age-related cataract, left eye: Secondary | ICD-10-CM | POA: Diagnosis not present

## 2021-05-24 DIAGNOSIS — Z79899 Other long term (current) drug therapy: Secondary | ICD-10-CM | POA: Diagnosis not present

## 2021-05-24 DIAGNOSIS — Z9841 Cataract extraction status, right eye: Secondary | ICD-10-CM | POA: Diagnosis not present

## 2021-05-24 DIAGNOSIS — G4730 Sleep apnea, unspecified: Secondary | ICD-10-CM | POA: Diagnosis not present

## 2021-05-24 DIAGNOSIS — Z961 Presence of intraocular lens: Secondary | ICD-10-CM | POA: Diagnosis not present

## 2021-05-24 DIAGNOSIS — H2512 Age-related nuclear cataract, left eye: Secondary | ICD-10-CM | POA: Insufficient documentation

## 2021-05-24 DIAGNOSIS — Z888 Allergy status to other drugs, medicaments and biological substances status: Secondary | ICD-10-CM | POA: Insufficient documentation

## 2021-05-24 SURGERY — CATARACT EXTRACTION PHACO AND INTRAOCULAR LENS PLACEMENT (IOC) with placement of Corticosteroid
Anesthesia: Monitor Anesthesia Care | Site: Eye | Laterality: Left

## 2021-05-24 MED ORDER — DEXAMETHASONE 0.4 MG OP INST
VAGINAL_INSERT | OPHTHALMIC | Status: DC | PRN
  Administered 2021-05-24: 0.4 mg via OPHTHALMIC

## 2021-05-24 MED ORDER — ORAL CARE MOUTH RINSE: 15.0000 mL | Freq: Once | OROMUCOSAL | Status: DC

## 2021-05-24 MED ORDER — STERILE WATER FOR IRRIGATION IR SOLN
Status: DC | PRN
  Administered 2021-05-24: 250 mL

## 2021-05-24 MED ORDER — SODIUM HYALURONATE 10 MG/ML IO SOLUTION
PREFILLED_SYRINGE | INTRAOCULAR | Status: DC | PRN
  Administered 2021-05-24: 0.85 mL via INTRAOCULAR

## 2021-05-24 MED ORDER — TROPICAMIDE 1 % OP SOLN
1.0000 [drp] | OPHTHALMIC | Status: AC
  Administered 2021-05-24 (×3): 1 [drp] via OPHTHALMIC

## 2021-05-24 MED ORDER — PHENYLEPHRINE HCL 2.5 % OP SOLN
1.0000 [drp] | OPHTHALMIC | Status: AC | PRN
  Administered 2021-05-24 (×3): 1 [drp] via OPHTHALMIC

## 2021-05-24 MED ORDER — TETRACAINE HCL 0.5 % OP SOLN
1.0000 [drp] | OPHTHALMIC | Status: AC | PRN
  Administered 2021-05-24 (×3): 1 [drp] via OPHTHALMIC

## 2021-05-24 MED ORDER — BSS IO SOLN
INTRAOCULAR | Status: DC | PRN
  Administered 2021-05-24: 15 mL via INTRAOCULAR

## 2021-05-24 MED ORDER — DEXAMETHASONE 0.4 MG OP INST
VAGINAL_INSERT | OPHTHALMIC | Status: AC
  Filled 2021-05-24: qty 1

## 2021-05-24 MED ORDER — SODIUM HYALURONATE 23MG/ML IO SOSY
PREFILLED_SYRINGE | INTRAOCULAR | Status: DC | PRN
  Administered 2021-05-24: 0.6 mL via INTRAOCULAR

## 2021-05-24 MED ORDER — POVIDONE-IODINE 5 % OP SOLN
OPHTHALMIC | Status: DC | PRN
  Administered 2021-05-24: 1 via OPHTHALMIC

## 2021-05-24 MED ORDER — TETRACAINE HCL 0.5 % OP SOLN
OPHTHALMIC | Status: AC
  Filled 2021-05-24: qty 4

## 2021-05-24 MED ORDER — NEOMYCIN-POLYMYXIN-DEXAMETH 3.5-10000-0.1 OP SUSP
OPHTHALMIC | Status: AC
  Filled 2021-05-24: qty 5

## 2021-05-24 MED ORDER — PHENYLEPHRINE HCL 2.5 % OP SOLN
OPHTHALMIC | Status: AC
  Filled 2021-05-24: qty 15

## 2021-05-24 MED ORDER — TROPICAMIDE 1 % OP SOLN
OPHTHALMIC | Status: AC
  Filled 2021-05-24: qty 3

## 2021-05-24 MED ORDER — CHLORHEXIDINE GLUCONATE 0.12 % MT SOLN: 15.0000 mL | Freq: Once | OROMUCOSAL | Status: DC

## 2021-05-24 MED ORDER — LIDOCAINE HCL (PF) 1 % IJ SOLN
INTRAOCULAR | Status: DC | PRN
  Administered 2021-05-24: 1 mL via OPHTHALMIC

## 2021-05-24 MED ORDER — LIDOCAINE HCL (PF) 1 % IJ SOLN
INTRAMUSCULAR | Status: AC
  Filled 2021-05-24: qty 2

## 2021-05-24 MED ORDER — LIDOCAINE HCL 3.5 % OP GEL: 1.0000 "application " | Freq: Once | OPHTHALMIC | Status: AC

## 2021-05-24 MED ORDER — PHENYLEPHRINE-KETOROLAC 1-0.3 % IO SOLN
INTRAOCULAR | Status: DC | PRN
  Administered 2021-05-24: 500 mL via OPHTHALMIC

## 2021-05-24 MED ORDER — LIDOCAINE HCL 3.5 % OP GEL
OPHTHALMIC | Status: AC
  Administered 2021-05-24: 1 via OPHTHALMIC
  Filled 2021-05-24: qty 1

## 2021-05-24 SURGICAL SUPPLY — 11 items
CLOTH BEACON ORANGE TIMEOUT ST (SAFETY) ×2 IMPLANT
EYE SHIELD UNIVERSAL CLEAR (GAUZE/BANDAGES/DRESSINGS) ×2 IMPLANT
GLOVE SURG UNDER POLY LF SZ6.5 (GLOVE) ×2 IMPLANT
GLOVE SURG UNDER POLY LF SZ7 (GLOVE) ×2 IMPLANT
NEEDLE HYPO 18GX1.5 BLUNT FILL (NEEDLE) ×2 IMPLANT
PAD ARMBOARD 7.5X6 YLW CONV (MISCELLANEOUS) ×2 IMPLANT
SYR TB 1ML LL NO SAFETY (SYRINGE) ×2 IMPLANT
TAPE SURG TRANSPORE 1 IN (GAUZE/BANDAGES/DRESSINGS) ×1 IMPLANT
TAPE SURGICAL TRANSPORE 1 IN (GAUZE/BANDAGES/DRESSINGS) ×2
TECHNIS 1-PIECE IOL (Intraocular Lens) ×2 IMPLANT
WATER STERILE IRR 250ML POUR (IV SOLUTION) ×2 IMPLANT

## 2021-05-24 NOTE — Op Note (Signed)
Date of procedure: 05/24/21  Pre-operative diagnosis: Visually significant age-related nuclear cataract, Left Eye (H25.12)  Post-operative diagnosis:  Visually significant age-related nuclear cataract, Left Eye (H25.812) 2.   Pain and inflammation following cataract surgery, Left Eye (H57.12)  Procedure:  Removal of cataract via phacoemulsification and insertion of intra-ocular lens [employer] DCB00  +22.5D into the capsular bag of the Left Eye 2. Placement of Dextenza Implant, Left Lower Lid  Attending surgeon: Gerda Diss. Lamond Glantz, MD, MA  Anesthesia: MAC, Topical Akten  Complications: None  Estimated Blood Loss: <71m (minimal)  Specimens: None  Implants: As above  Indications:  Visually significant age-related cataract, Left Eye  Procedure:  The patient was seen and identified in the pre-operative area. The operative eye was identified and dilated.  The operative eye was marked.  Topical anesthesia was administered to the operative eye.     The patient was then to the operative suite and placed in the supine position.  A timeout was performed confirming the patient, procedure to be performed, and all other relevant information.   The patient's face was prepped and draped in the usual fashion for intra-ocular surgery.  A lid speculum was placed into the operative eye and the surgical microscope moved into place and focused.  An inferotemporal paracentesis was created using a 20 gauge paracentesis blade.  Shugarcaine was injected into the anterior chamber.  Viscoelastic was injected into the anterior chamber.  A temporal clear-corneal main wound incision was created using a 2.491mmicrokeratome.  A continuous curvilinear capsulorrhexis was initiated using an irrigating cystitome and completed using capsulorrhexis forceps.  Hydrodissection and hydrodeliniation were performed.  Viscoelastic was injected into the anterior chamber.  A phacoemulsification handpiece and a chopper  as a second instrument were used to remove the nucleus and epinucleus. The irrigation/aspiration handpiece was used to remove any remaining cortical material.   The capsular bag was reinflated with viscoelastic, checked, and found to be intact.  The intraocular lens was inserted into the capsular bag.  The irrigation/aspiration handpiece was used to remove any remaining viscoelastic.  The clear corneal wound and paracentesis wounds were then hydrated and checked with Weck-Cels to be watertight.    The lid-speculum was removed. The lower punctum was dilated. A Dextenza implant was placed in the lower canaliculus without complication.  The drape was removed.  The patient's face was cleaned with a wet and dry 4x4.   A clear shield was taped over the eye. The patient was taken to the post-operative care unit in good condition, having tolerated the procedure well.  Post-Op Instructions: [facility] and will receive all other orders and instructions.

## 2021-05-24 NOTE — Interval H&P Note (Signed)
History and Physical Interval Note:  05/24/2021 11:00 AM  [name] [name]  has presented today for surgery, with the diagnosis of Nuclear sclerotic cataract - Left eye.  The various methods of treatment have been discussed with the patient and family. After consideration of risks, benefits and other options for treatment, the patient has consented to  Procedure(s) with comments: CATARACT EXTRACTION PHACO AND INTRAOCULAR LENS PLACEMENT ([place]) with placement of Corticosteroid (Left) - left as a surgical intervention.  The patient's history has been reviewed, patient examined, no change in status, stable for surgery.  I have reviewed the patient's chart and labs.  Questions were answered to the patient's satisfaction.     Baruch Goldmann

## 2021-05-24 NOTE — Discharge Instructions (Signed)
Please discharge patient when stable, will follow up today with Dr. Joud Ingwersen at the [facility] office immediately following discharge.  Leave shield in place until visit.  All paperwork with discharge instructions will be given at the office.  [facility] Address:  [address]  [place], [place] [address]  

## 2021-05-24 NOTE — Anesthesia Postprocedure Evaluation (Signed)
Anesthesia Post Note  Patient: DOMINIC MAHANEY  Procedure(s) Performed: CATARACT EXTRACTION PHACO AND INTRAOCULAR LENS PLACEMENT ([facility]) with placement of Corticosteroid (Left: Eye)  Patient location during evaluation: Phase II Anesthesia Type: MAC Level of consciousness: awake and alert and oriented Pain management: pain level controlled Vital Signs Assessment: post-procedure vital signs reviewed and stable Respiratory status: spontaneous breathing and respiratory function stable Cardiovascular status: stable and blood pressure returned to baseline Postop Assessment: no apparent nausea or vomiting Anesthetic complications: no   No notable events documented.   Last Vitals:  Vitals:   05/24/21 0941 05/24/21 1126  BP: 138/75 (!) 165/89  Pulse: 73 70  Resp: 20 18  Temp: 36.7 C 36.5 C  SpO2: 96% 97%    Last Pain:  Vitals:   05/24/21 1126  TempSrc: Axillary  PainSc: 0-No pain                 Reeve Turnley C Vondell Babers

## 2021-05-24 NOTE — Anesthesia Procedure Notes (Signed)
Procedure Name: MAC Date/Time: 05/24/2021 11:09 AM Performed by: Orlie Dakin, CRNA Pre-anesthesia Checklist: Patient identified, Emergency Drugs available, Suction available and Patient being monitored Patient Re-evaluated:Patient Re-evaluated prior to induction Oxygen Delivery Method: Nasal cannula Placement Confirmation: positive ETCO2

## 2021-05-24 NOTE — Transfer of Care (Signed)
Immediate Anesthesia Transfer of Care Note  Patient: [name] [name]  Procedure(s) Performed: CATARACT EXTRACTION PHACO AND INTRAOCULAR LENS PLACEMENT ([facility]) with placement of Corticosteroid (Left: Eye)  Patient Location: Short Stay  Anesthesia Type:MAC  Level of Consciousness: awake, alert  and oriented  Airway & Oxygen Therapy: Patient Spontanous Breathing  Post-op Assessment: Report given to RN and Post -op Vital signs reviewed and stable  Post vital signs: Reviewed and stable  Last Vitals:  Vitals Value Taken Time  BP    Temp    Pulse    Resp    SpO2      Last Pain:  Vitals:   05/24/21 0941  TempSrc: Oral  PainSc: 0-No pain         Complications: No notable events documented.

## 2021-05-24 NOTE — Anesthesia Preprocedure Evaluation (Addendum)
Anesthesia Evaluation  Patient identified by MRN, date of birth, ID band Patient awake    Reviewed: Allergy & Precautions, NPO status , Patient's Chart, lab work & pertinent test results  Airway Mallampati: II  TM Distance: >3 FB Neck ROM: Full    Dental  (+) Dental Advisory Given, Caps   Pulmonary sleep apnea and Continuous Positive Airway Pressure Ventilation ,    Pulmonary exam normal breath sounds clear to auscultation       Cardiovascular Exercise Tolerance: Good hypertension, Pt. on medications (-) CAD Normal cardiovascular exam Rhythm:Regular Rate:Normal     Neuro/Psych negative neurological ROS  negative psych ROS   GI/Hepatic negative GI ROS, Neg liver ROS,   Endo/Other  negative endocrine ROS  Renal/GU negative Renal ROS     Musculoskeletal negative musculoskeletal ROS (+)   Abdominal   Peds  Hematology negative hematology ROS (+)   Anesthesia Other Findings   Reproductive/Obstetrics negative OB ROS                           Anesthesia Physical Anesthesia Plan  ASA: 2  Anesthesia Plan: MAC   Post-op Pain Management:    Induction:   PONV Risk Score and Plan:   Airway Management Planned: Nasal Cannula and Natural Airway  Additional Equipment:   Intra-op Plan:   Post-operative Plan:   Informed Consent: I have reviewed the patients History and Physical, chart, labs and discussed the procedure including the risks, benefits and alternatives for the proposed anesthesia with the patient or authorized representative who has indicated his/her understanding and acceptance.     Dental advisory given  Plan Discussed with: CRNA and Surgeon  Anesthesia Plan Comments:        Anesthesia Quick Evaluation

## 2021-07-15 DIAGNOSIS — D0362 Melanoma in situ of left upper limb, including shoulder: Secondary | ICD-10-CM | POA: Diagnosis not present

## 2021-07-15 DIAGNOSIS — D225 Melanocytic nevi of trunk: Secondary | ICD-10-CM | POA: Diagnosis not present

## 2021-07-15 DIAGNOSIS — D0339 Melanoma in situ of other parts of face: Secondary | ICD-10-CM | POA: Diagnosis not present

## 2021-07-15 DIAGNOSIS — Z1283 Encounter for screening for malignant neoplasm of skin: Secondary | ICD-10-CM | POA: Diagnosis not present

## 2021-07-24 DIAGNOSIS — D225 Melanocytic nevi of trunk: Secondary | ICD-10-CM | POA: Diagnosis not present

## 2021-07-24 DIAGNOSIS — D485 Neoplasm of uncertain behavior of skin: Secondary | ICD-10-CM | POA: Diagnosis not present

## 2021-07-24 DIAGNOSIS — L98499 Non-pressure chronic ulcer of skin of other sites with unspecified severity: Secondary | ICD-10-CM | POA: Diagnosis not present

## 2021-07-24 DIAGNOSIS — D0362 Melanoma in situ of left upper limb, including shoulder: Secondary | ICD-10-CM | POA: Diagnosis not present

## 2021-08-06 DIAGNOSIS — Z23 Encounter for immunization: Secondary | ICD-10-CM | POA: Diagnosis not present

## 2021-08-20 DIAGNOSIS — L989 Disorder of the skin and subcutaneous tissue, unspecified: Secondary | ICD-10-CM | POA: Diagnosis not present

## 2021-08-20 DIAGNOSIS — L905 Scar conditions and fibrosis of skin: Secondary | ICD-10-CM | POA: Diagnosis not present

## 2021-08-20 DIAGNOSIS — D0339 Melanoma in situ of other parts of face: Secondary | ICD-10-CM | POA: Diagnosis not present

## 2021-08-20 DIAGNOSIS — D485 Neoplasm of uncertain behavior of skin: Secondary | ICD-10-CM | POA: Diagnosis not present

## 2021-08-27 DIAGNOSIS — Z85820 Personal history of malignant melanoma of skin: Secondary | ICD-10-CM | POA: Diagnosis not present

## 2021-08-27 DIAGNOSIS — C44319 Basal cell carcinoma of skin of other parts of face: Secondary | ICD-10-CM | POA: Diagnosis not present

## 2021-08-27 DIAGNOSIS — X32XXXD Exposure to sunlight, subsequent encounter: Secondary | ICD-10-CM | POA: Diagnosis not present

## 2021-08-27 DIAGNOSIS — L570 Actinic keratosis: Secondary | ICD-10-CM | POA: Diagnosis not present

## 2021-08-27 DIAGNOSIS — Z1283 Encounter for screening for malignant neoplasm of skin: Secondary | ICD-10-CM | POA: Diagnosis not present

## 2021-08-27 DIAGNOSIS — Z08 Encounter for follow-up examination after completed treatment for malignant neoplasm: Secondary | ICD-10-CM | POA: Diagnosis not present

## 2021-10-23 DIAGNOSIS — Z1283 Encounter for screening for malignant neoplasm of skin: Secondary | ICD-10-CM | POA: Diagnosis not present

## 2021-10-23 DIAGNOSIS — Z85820 Personal history of malignant melanoma of skin: Secondary | ICD-10-CM | POA: Diagnosis not present

## 2021-10-23 DIAGNOSIS — Z08 Encounter for follow-up examination after completed treatment for malignant neoplasm: Secondary | ICD-10-CM | POA: Diagnosis not present

## 2021-10-23 DIAGNOSIS — Z85828 Personal history of other malignant neoplasm of skin: Secondary | ICD-10-CM | POA: Diagnosis not present

## 2021-10-23 DIAGNOSIS — D225 Melanocytic nevi of trunk: Secondary | ICD-10-CM | POA: Diagnosis not present

## 2021-10-23 DIAGNOSIS — D0362 Melanoma in situ of left upper limb, including shoulder: Secondary | ICD-10-CM | POA: Diagnosis not present

## 2021-11-27 DIAGNOSIS — X32XXXD Exposure to sunlight, subsequent encounter: Secondary | ICD-10-CM | POA: Diagnosis not present

## 2021-11-27 DIAGNOSIS — L570 Actinic keratosis: Secondary | ICD-10-CM | POA: Diagnosis not present

## 2021-12-10 DIAGNOSIS — C439 Malignant melanoma of skin, unspecified: Secondary | ICD-10-CM | POA: Diagnosis not present

## 2021-12-10 DIAGNOSIS — D0362 Melanoma in situ of left upper limb, including shoulder: Secondary | ICD-10-CM | POA: Diagnosis not present

## 2021-12-10 DIAGNOSIS — L905 Scar conditions and fibrosis of skin: Secondary | ICD-10-CM | POA: Diagnosis not present

## 2021-12-10 DIAGNOSIS — Z481 Encounter for planned postprocedural wound closure: Secondary | ICD-10-CM | POA: Diagnosis not present

## 2021-12-10 DIAGNOSIS — L989 Disorder of the skin and subcutaneous tissue, unspecified: Secondary | ICD-10-CM | POA: Diagnosis not present

## 2022-01-13 DIAGNOSIS — I10 Essential (primary) hypertension: Secondary | ICD-10-CM | POA: Diagnosis not present

## 2022-01-13 DIAGNOSIS — Z0001 Encounter for general adult medical examination with abnormal findings: Secondary | ICD-10-CM | POA: Diagnosis not present

## 2022-01-13 DIAGNOSIS — Z125 Encounter for screening for malignant neoplasm of prostate: Secondary | ICD-10-CM | POA: Diagnosis not present

## 2022-01-13 DIAGNOSIS — N400 Enlarged prostate without lower urinary tract symptoms: Secondary | ICD-10-CM | POA: Diagnosis not present

## 2022-01-13 DIAGNOSIS — Z1331 Encounter for screening for depression: Secondary | ICD-10-CM | POA: Diagnosis not present

## 2022-01-13 DIAGNOSIS — E7849 Other hyperlipidemia: Secondary | ICD-10-CM | POA: Diagnosis not present

## 2022-01-13 DIAGNOSIS — Z6824 Body mass index (BMI) 24.0-24.9, adult: Secondary | ICD-10-CM | POA: Diagnosis not present

## 2022-01-13 DIAGNOSIS — E782 Mixed hyperlipidemia: Secondary | ICD-10-CM | POA: Diagnosis not present

## 2022-01-13 DIAGNOSIS — C439 Malignant melanoma of skin, unspecified: Secondary | ICD-10-CM | POA: Diagnosis not present

## 2022-01-28 DIAGNOSIS — Z85820 Personal history of malignant melanoma of skin: Secondary | ICD-10-CM | POA: Diagnosis not present

## 2022-01-28 DIAGNOSIS — Z1283 Encounter for screening for malignant neoplasm of skin: Secondary | ICD-10-CM | POA: Diagnosis not present

## 2022-01-28 DIAGNOSIS — X32XXXD Exposure to sunlight, subsequent encounter: Secondary | ICD-10-CM | POA: Diagnosis not present

## 2022-01-28 DIAGNOSIS — L570 Actinic keratosis: Secondary | ICD-10-CM | POA: Diagnosis not present

## 2022-01-28 DIAGNOSIS — D225 Melanocytic nevi of trunk: Secondary | ICD-10-CM | POA: Diagnosis not present

## 2022-01-28 DIAGNOSIS — Z08 Encounter for follow-up examination after completed treatment for malignant neoplasm: Secondary | ICD-10-CM | POA: Diagnosis not present

## 2022-03-25 ENCOUNTER — Encounter: Payer: Self-pay | Admitting: *Deleted

## 2022-03-27 DIAGNOSIS — Z1212 Encounter for screening for malignant neoplasm of rectum: Secondary | ICD-10-CM | POA: Diagnosis not present

## 2022-03-27 DIAGNOSIS — Z1211 Encounter for screening for malignant neoplasm of colon: Secondary | ICD-10-CM | POA: Diagnosis not present

## 2022-04-30 DIAGNOSIS — D485 Neoplasm of uncertain behavior of skin: Secondary | ICD-10-CM | POA: Diagnosis not present

## 2022-04-30 DIAGNOSIS — Z85820 Personal history of malignant melanoma of skin: Secondary | ICD-10-CM | POA: Diagnosis not present

## 2022-04-30 DIAGNOSIS — X32XXXD Exposure to sunlight, subsequent encounter: Secondary | ICD-10-CM | POA: Diagnosis not present

## 2022-04-30 DIAGNOSIS — Z08 Encounter for follow-up examination after completed treatment for malignant neoplasm: Secondary | ICD-10-CM | POA: Diagnosis not present

## 2022-04-30 DIAGNOSIS — L570 Actinic keratosis: Secondary | ICD-10-CM | POA: Diagnosis not present

## 2022-04-30 DIAGNOSIS — Z1283 Encounter for screening for malignant neoplasm of skin: Secondary | ICD-10-CM | POA: Diagnosis not present

## 2022-04-30 DIAGNOSIS — D225 Melanocytic nevi of trunk: Secondary | ICD-10-CM | POA: Diagnosis not present

## 2022-05-09 DIAGNOSIS — Z1283 Encounter for screening for malignant neoplasm of skin: Secondary | ICD-10-CM | POA: Diagnosis not present

## 2022-05-09 DIAGNOSIS — L988 Other specified disorders of the skin and subcutaneous tissue: Secondary | ICD-10-CM | POA: Diagnosis not present

## 2022-05-09 DIAGNOSIS — D485 Neoplasm of uncertain behavior of skin: Secondary | ICD-10-CM | POA: Diagnosis not present

## 2022-07-25 DIAGNOSIS — Z23 Encounter for immunization: Secondary | ICD-10-CM | POA: Diagnosis not present

## 2022-07-30 DIAGNOSIS — Z08 Encounter for follow-up examination after completed treatment for malignant neoplasm: Secondary | ICD-10-CM | POA: Diagnosis not present

## 2022-07-30 DIAGNOSIS — Z85820 Personal history of malignant melanoma of skin: Secondary | ICD-10-CM | POA: Diagnosis not present

## 2022-07-30 DIAGNOSIS — D225 Melanocytic nevi of trunk: Secondary | ICD-10-CM | POA: Diagnosis not present

## 2022-07-30 DIAGNOSIS — Z1283 Encounter for screening for malignant neoplasm of skin: Secondary | ICD-10-CM | POA: Diagnosis not present

## 2022-09-16 DIAGNOSIS — H26491 Other secondary cataract, right eye: Secondary | ICD-10-CM | POA: Diagnosis not present

## 2022-09-23 ENCOUNTER — Encounter: Payer: Self-pay | Admitting: *Deleted

## 2022-10-29 DIAGNOSIS — Z85820 Personal history of malignant melanoma of skin: Secondary | ICD-10-CM | POA: Diagnosis not present

## 2022-10-29 DIAGNOSIS — D0339 Melanoma in situ of other parts of face: Secondary | ICD-10-CM | POA: Diagnosis not present

## 2022-10-29 DIAGNOSIS — D2271 Melanocytic nevi of right lower limb, including hip: Secondary | ICD-10-CM | POA: Diagnosis not present

## 2022-10-29 DIAGNOSIS — D485 Neoplasm of uncertain behavior of skin: Secondary | ICD-10-CM | POA: Diagnosis not present

## 2022-10-29 DIAGNOSIS — Z08 Encounter for follow-up examination after completed treatment for malignant neoplasm: Secondary | ICD-10-CM | POA: Diagnosis not present

## 2022-10-29 DIAGNOSIS — D225 Melanocytic nevi of trunk: Secondary | ICD-10-CM | POA: Diagnosis not present

## 2022-10-29 DIAGNOSIS — Z1283 Encounter for screening for malignant neoplasm of skin: Secondary | ICD-10-CM | POA: Diagnosis not present

## 2022-11-05 DIAGNOSIS — D485 Neoplasm of uncertain behavior of skin: Secondary | ICD-10-CM | POA: Diagnosis not present

## 2022-11-05 DIAGNOSIS — L98499 Non-pressure chronic ulcer of skin of other sites with unspecified severity: Secondary | ICD-10-CM | POA: Diagnosis not present

## 2022-11-07 DIAGNOSIS — I10 Essential (primary) hypertension: Secondary | ICD-10-CM | POA: Diagnosis not present

## 2022-11-07 DIAGNOSIS — E782 Mixed hyperlipidemia: Secondary | ICD-10-CM | POA: Diagnosis not present

## 2022-11-07 DIAGNOSIS — E74818 Other disorders of glucose transport: Secondary | ICD-10-CM | POA: Diagnosis not present

## 2022-11-13 DIAGNOSIS — H01002 Unspecified blepharitis right lower eyelid: Secondary | ICD-10-CM | POA: Diagnosis not present

## 2022-11-13 DIAGNOSIS — H01004 Unspecified blepharitis left upper eyelid: Secondary | ICD-10-CM | POA: Diagnosis not present

## 2022-11-13 DIAGNOSIS — H35373 Puckering of macula, bilateral: Secondary | ICD-10-CM | POA: Diagnosis not present

## 2022-11-13 DIAGNOSIS — H26493 Other secondary cataract, bilateral: Secondary | ICD-10-CM | POA: Diagnosis not present

## 2022-11-13 DIAGNOSIS — H01001 Unspecified blepharitis right upper eyelid: Secondary | ICD-10-CM | POA: Diagnosis not present

## 2022-12-11 DIAGNOSIS — L910 Hypertrophic scar: Secondary | ICD-10-CM | POA: Diagnosis not present

## 2022-12-11 DIAGNOSIS — L989 Disorder of the skin and subcutaneous tissue, unspecified: Secondary | ICD-10-CM | POA: Diagnosis not present

## 2022-12-11 DIAGNOSIS — L905 Scar conditions and fibrosis of skin: Secondary | ICD-10-CM | POA: Diagnosis not present

## 2022-12-11 DIAGNOSIS — D0339 Melanoma in situ of other parts of face: Secondary | ICD-10-CM | POA: Diagnosis not present

## 2022-12-17 DIAGNOSIS — D0339 Melanoma in situ of other parts of face: Secondary | ICD-10-CM | POA: Diagnosis not present

## 2022-12-26 DIAGNOSIS — E663 Overweight: Secondary | ICD-10-CM | POA: Diagnosis not present

## 2022-12-26 DIAGNOSIS — E782 Mixed hyperlipidemia: Secondary | ICD-10-CM | POA: Diagnosis not present

## 2022-12-26 DIAGNOSIS — Z0001 Encounter for general adult medical examination with abnormal findings: Secondary | ICD-10-CM | POA: Diagnosis not present

## 2022-12-26 DIAGNOSIS — I10 Essential (primary) hypertension: Secondary | ICD-10-CM | POA: Diagnosis not present

## 2022-12-26 DIAGNOSIS — Z125 Encounter for screening for malignant neoplasm of prostate: Secondary | ICD-10-CM | POA: Diagnosis not present

## 2022-12-26 DIAGNOSIS — Z1331 Encounter for screening for depression: Secondary | ICD-10-CM | POA: Diagnosis not present

## 2022-12-26 DIAGNOSIS — E74818 Other disorders of glucose transport: Secondary | ICD-10-CM | POA: Diagnosis not present

## 2022-12-26 DIAGNOSIS — C439 Malignant melanoma of skin, unspecified: Secondary | ICD-10-CM | POA: Diagnosis not present

## 2022-12-26 DIAGNOSIS — E7849 Other hyperlipidemia: Secondary | ICD-10-CM | POA: Diagnosis not present

## 2022-12-26 DIAGNOSIS — Z6824 Body mass index (BMI) 24.0-24.9, adult: Secondary | ICD-10-CM | POA: Diagnosis not present

## 2023-01-27 DIAGNOSIS — Z08 Encounter for follow-up examination after completed treatment for malignant neoplasm: Secondary | ICD-10-CM | POA: Diagnosis not present

## 2023-01-27 DIAGNOSIS — Z1283 Encounter for screening for malignant neoplasm of skin: Secondary | ICD-10-CM | POA: Diagnosis not present

## 2023-01-27 DIAGNOSIS — Z85820 Personal history of malignant melanoma of skin: Secondary | ICD-10-CM | POA: Diagnosis not present

## 2023-01-27 DIAGNOSIS — L820 Inflamed seborrheic keratosis: Secondary | ICD-10-CM | POA: Diagnosis not present

## 2023-05-05 DIAGNOSIS — D225 Melanocytic nevi of trunk: Secondary | ICD-10-CM | POA: Diagnosis not present

## 2023-05-05 DIAGNOSIS — Z1283 Encounter for screening for malignant neoplasm of skin: Secondary | ICD-10-CM | POA: Diagnosis not present

## 2023-05-05 DIAGNOSIS — Z08 Encounter for follow-up examination after completed treatment for malignant neoplasm: Secondary | ICD-10-CM | POA: Diagnosis not present

## 2023-05-05 DIAGNOSIS — Z85820 Personal history of malignant melanoma of skin: Secondary | ICD-10-CM | POA: Diagnosis not present

## 2023-07-27 DIAGNOSIS — Z23 Encounter for immunization: Secondary | ICD-10-CM | POA: Diagnosis not present

## 2023-08-04 DIAGNOSIS — Z1283 Encounter for screening for malignant neoplasm of skin: Secondary | ICD-10-CM | POA: Diagnosis not present

## 2023-08-04 DIAGNOSIS — Z85820 Personal history of malignant melanoma of skin: Secondary | ICD-10-CM | POA: Diagnosis not present

## 2023-08-04 DIAGNOSIS — L570 Actinic keratosis: Secondary | ICD-10-CM | POA: Diagnosis not present

## 2023-08-04 DIAGNOSIS — Z08 Encounter for follow-up examination after completed treatment for malignant neoplasm: Secondary | ICD-10-CM | POA: Diagnosis not present

## 2023-08-04 DIAGNOSIS — D225 Melanocytic nevi of trunk: Secondary | ICD-10-CM | POA: Diagnosis not present

## 2023-08-04 DIAGNOSIS — D485 Neoplasm of uncertain behavior of skin: Secondary | ICD-10-CM | POA: Diagnosis not present

## 2023-08-04 DIAGNOSIS — X32XXXD Exposure to sunlight, subsequent encounter: Secondary | ICD-10-CM | POA: Diagnosis not present
